# Patient Record
Sex: Female | Born: 1975 | Race: Black or African American | Hispanic: No | Marital: Married | Smoking: Never smoker
Health system: Southern US, Community
[De-identification: ages and names within clinical notes are randomized; demographics above are authoritative.]

## PROBLEM LIST (undated history)

## (undated) DIAGNOSIS — F32A Depression, unspecified: Secondary | ICD-10-CM

## (undated) DIAGNOSIS — K5900 Constipation, unspecified: Secondary | ICD-10-CM

## (undated) DIAGNOSIS — H5210 Myopia, unspecified eye: Secondary | ICD-10-CM

## (undated) DIAGNOSIS — H524 Presbyopia: Secondary | ICD-10-CM

## (undated) DIAGNOSIS — M25472 Effusion, left ankle: Secondary | ICD-10-CM

## (undated) DIAGNOSIS — D259 Leiomyoma of uterus, unspecified: Secondary | ICD-10-CM

## (undated) DIAGNOSIS — M25474 Effusion, right foot: Secondary | ICD-10-CM

## (undated) DIAGNOSIS — R0602 Shortness of breath: Secondary | ICD-10-CM

## (undated) DIAGNOSIS — I517 Cardiomegaly: Secondary | ICD-10-CM

## (undated) DIAGNOSIS — R5383 Other fatigue: Secondary | ICD-10-CM

## (undated) DIAGNOSIS — F419 Anxiety disorder, unspecified: Secondary | ICD-10-CM

## (undated) DIAGNOSIS — R002 Palpitations: Secondary | ICD-10-CM

## (undated) DIAGNOSIS — H52209 Unspecified astigmatism, unspecified eye: Secondary | ICD-10-CM

## (undated) DIAGNOSIS — M25471 Effusion, right ankle: Secondary | ICD-10-CM

## (undated) DIAGNOSIS — M25475 Effusion, left foot: Secondary | ICD-10-CM

## (undated) DIAGNOSIS — N938 Other specified abnormal uterine and vaginal bleeding: Secondary | ICD-10-CM

## (undated) DIAGNOSIS — I10 Essential (primary) hypertension: Secondary | ICD-10-CM

## (undated) HISTORY — DX: Shortness of breath: R06.02

## (undated) HISTORY — DX: Depression, unspecified: F32.A

## (undated) HISTORY — PX: TONSILLECTOMY AND ADENOIDECTOMY: SHX28

## (undated) HISTORY — PX: TONSILLECTOMY: SUR1361

## (undated) HISTORY — DX: Effusion, left ankle: M25.472

## (undated) HISTORY — DX: Unspecified astigmatism, unspecified eye: H52.209

## (undated) HISTORY — DX: Presbyopia: H52.4

## (undated) HISTORY — DX: Other fatigue: R53.83

## (undated) HISTORY — DX: Palpitations: R00.2

## (undated) HISTORY — DX: Constipation, unspecified: K59.00

## (undated) HISTORY — DX: Cardiomegaly: I51.7

## (undated) HISTORY — DX: Effusion, left foot: M25.471

## (undated) HISTORY — DX: Effusion, left foot: M25.475

## (undated) HISTORY — DX: Effusion, right foot: M25.474

## (undated) HISTORY — DX: Other specified abnormal uterine and vaginal bleeding: N93.8

## (undated) HISTORY — DX: Myopia, unspecified eye: H52.10

## (undated) HISTORY — DX: Leiomyoma of uterus, unspecified: D25.9

---

## 1994-11-26 HISTORY — PX: TONSILLECTOMY: SUR1361

## 2004-07-20 ENCOUNTER — Emergency Department (HOSPITAL_COMMUNITY): Admission: EM | Admit: 2004-07-20 | Discharge: 2004-07-20 | Payer: Self-pay | Admitting: Emergency Medicine

## 2004-08-06 ENCOUNTER — Emergency Department (HOSPITAL_COMMUNITY): Admission: EM | Admit: 2004-08-06 | Discharge: 2004-08-06 | Payer: Self-pay | Admitting: Emergency Medicine

## 2005-08-13 ENCOUNTER — Emergency Department (HOSPITAL_COMMUNITY): Admission: EM | Admit: 2005-08-13 | Discharge: 2005-08-13 | Payer: Self-pay | Admitting: Emergency Medicine

## 2005-11-27 ENCOUNTER — Emergency Department (HOSPITAL_COMMUNITY): Admission: EM | Admit: 2005-11-27 | Discharge: 2005-11-27 | Payer: Self-pay | Admitting: Emergency Medicine

## 2006-03-10 ENCOUNTER — Emergency Department (HOSPITAL_COMMUNITY): Admission: EM | Admit: 2006-03-10 | Discharge: 2006-03-11 | Payer: Self-pay | Admitting: Emergency Medicine

## 2007-04-05 ENCOUNTER — Emergency Department (HOSPITAL_COMMUNITY): Admission: EM | Admit: 2007-04-05 | Discharge: 2007-04-05 | Payer: Self-pay | Admitting: Emergency Medicine

## 2015-01-11 ENCOUNTER — Encounter (HOSPITAL_COMMUNITY): Payer: Self-pay | Admitting: *Deleted

## 2015-01-11 ENCOUNTER — Emergency Department (HOSPITAL_COMMUNITY)
Admission: EM | Admit: 2015-01-11 | Discharge: 2015-01-11 | Disposition: A | Discharge status: Home / Self Care | Payer: 59 | Source: Home / Self Care | Attending: Family Medicine | Admitting: Family Medicine

## 2015-01-11 ENCOUNTER — Emergency Department (INDEPENDENT_AMBULATORY_CARE_PROVIDER_SITE_OTHER): Payer: 59

## 2015-01-11 DIAGNOSIS — L03012 Cellulitis of left finger: Secondary | ICD-10-CM

## 2015-01-11 DIAGNOSIS — IMO0001 Reserved for inherently not codable concepts without codable children: Secondary | ICD-10-CM

## 2015-01-11 HISTORY — DX: Essential (primary) hypertension: I10

## 2015-01-11 MED ORDER — LIDOCAINE HCL (PF) 2 % IJ SOLN
INTRAMUSCULAR | Status: AC
  Filled 2015-01-11: qty 2

## 2015-01-11 MED ORDER — BUPIVACAINE HCL (PF) 0.5 % IJ SOLN
INTRAMUSCULAR | Status: AC
  Filled 2015-01-11: qty 10

## 2015-01-11 MED ORDER — HYDROCODONE-ACETAMINOPHEN 5-325 MG PO TABS: 1.0000 | ORAL_TABLET | ORAL | Status: DC | PRN

## 2015-01-11 MED ORDER — DOXYCYCLINE HYCLATE 100 MG PO CAPS: 100.0000 mg | ORAL_CAPSULE | Freq: Two times a day (BID) | ORAL | Status: DC

## 2015-01-11 NOTE — ED Notes (Addendum)
Pt reports    Swollen  Tender       l      Middle  Finger            X   4  Days        -      Also  Has  Some   Numbness   To  l   2nd    Toe    X  4  Days     -              Her  bp  Is  Elevated  As  Well         She  Has  Been  Non  Compliant     With  Her meds

## 2015-01-11 NOTE — ED Notes (Signed)
Wound dressed with adaptic, sterile 2x2 's, 1" cling and 1" coban by PA.

## 2015-01-11 NOTE — ED Notes (Signed)
Gaspar Bidding PA here and is doing I and D of felon.

## 2015-01-11 NOTE — ED Provider Notes (Signed)
CSN: [id number]     Arrival date & time 01/11/15  1508 History   First MD Initiated Contact with Patient 01/11/15 1626     Chief Complaint  Patient presents with  . Hand Pain   (Consider location/radiation/quality/duration/timing/severity/associated sxs/prior Treatment) Patient is a 39 y.o. female presenting with hand pain. The history is provided by the patient.  Hand Pain This is a new problem. The current episode started more than 2 days ago. The problem has been gradually worsening.    Past Medical History  Diagnosis Date  . Hypertension    History reviewed. No pertinent past surgical history. History reviewed. No pertinent family history. History  Substance Use Topics  . Smoking status: Never Smoker   . Smokeless tobacco: Not on file  . Alcohol Use: Yes   OB History    No data available     Review of Systems  Constitutional: Negative for fever and chills.  Musculoskeletal: Positive for joint swelling.  Skin: Positive for rash.    Allergies  Review of patient's allergies indicates no known allergies.  Home Medications   Prior to Admission medications   Medication Sig Start Date End Date Taking? Authorizing Provider  doxycycline (VIBRAMYCIN) 100 MG capsule Take 1 capsule (100 mg total) by mouth 2 (two) times daily. 01/11/15   Billy Fischer, MD  HYDROcodone-acetaminophen (NORCO/VICODIN) 5-325 MG per tablet Take 1 tablet by mouth every 4 (four) hours as needed for moderate pain or severe pain. 01/11/15   Billy Fischer, MD  Metoprolol Tartrate (LOPRESSOR PO) Take by mouth.    Historical Provider, MD   BP 180/110 mmHg  Pulse 72  Temp(Src) 98.6 F (37 C) (Oral)  Resp 18  SpO2 100%  LMP 12/28/2014 (Exact Date) Physical Exam  Constitutional: She is oriented to person, place, and time. She appears well-developed and well-nourished. She appears distressed.  Musculoskeletal: She exhibits tenderness.       Hands: Neurological: She is alert and oriented to person, place,  and time.  Skin: Skin is warm and dry.  Nursing note and vitals reviewed.   ED Course  Procedures (including critical care time) Labs Review Labs Reviewed  CULTURE, ROUTINE-ABSCESS    Imaging Review Dg Finger Middle Left  01/11/2015   CLINICAL DATA:  Onset LEFT middle finger swelling and pain 3 days ago, no known injury, question from infected cuticle, with swelling starting up LEFT arm yesterday  EXAM: LEFT MIDDLE FINGER 2+V  COMPARISON:  None  FINDINGS: Osseous mineralization grossly normal for technique.  Joint spaces preserved.  Tiny corticated old ossicle at volar aspect of PIP joint.  Soft tissue swelling LEFT middle finger.  No acute fracture, dislocation or bone destruction.  IMPRESSION: No acute osseous abnormalities.   Electronically Signed   By: Lavonia Dana M.D.   On: 01/11/2015 17:14     MDM   1. Paronychia of third finger, left    Referred to dr Amedeo Plenty for i+d of felon.    Billy Fischer, MD 01/11/15 [phone]

## 2015-01-11 NOTE — Discharge Instructions (Signed)
Care as discussed with dr Vanetta Shawl pa , follow-up on wed at 3pm,  Take medicines as prescribed.

## 2015-01-11 NOTE — Consult Note (Signed)
Jessica Griffin is an 39 y.o. female.   Chief Complaint: Left middle finger pain HPI: The patient is a very pleasant 39 year old female who has had progressive pain and swelling about the left middle finger beginning Saturday. She denies any antecedent trauma incident of injury. She states she has been soaking the finger in warm water which seems to help the pain unfortunately this is really current. She has had noted swelling increasing over the past 3 days and complains of point tenderness over the distal tip of the left middle finger. She is left-hand dominant. She is employed at [employer]. Given the progressive swelling and pain she presented to the urgent care setting for initial evaluation. She was initially seen and evaluated by Dr. Ruben Gottron, had noted concerns in regards to a possible paronychia process to the tip of the finger. Hand surgery consultation was implemented. Upon evaluation the patient denied any fevers, chills, or other constitutional symptoms. Grafts had been performed at the time of initial evaluation and were reviewed. Acute bony abnormalities were noted about the distal phalanx. There was a questionable old volar lip fracture about the PIP versus sesamoid. No Signs of deep bone infection were present about the distal phalanx. The patient does relate that her niece stays with her at times has had a prior diagnosis of MRSA.  Past Medical History  Diagnosis Date  . Hypertension     History reviewed. No pertinent past surgical history.  History reviewed. No pertinent family history. Social History:  reports that she has never smoked. She does not have any smokeless tobacco history on file. She reports that she drinks alcohol. Her drug history is not on file.  Allergies: No Known Allergies   (Not in a hospital admission)  No results found for this or any previous visit (from the past 48 hour(s)). Dg Finger Middle Left  01/11/2015   CLINICAL DATA:  Onset LEFT middle  finger swelling and pain 3 days ago, no known injury, question from infected cuticle, with swelling starting up LEFT arm yesterday  EXAM: LEFT MIDDLE FINGER 2+V  COMPARISON:  None  FINDINGS: Osseous mineralization grossly normal for technique.  Joint spaces preserved.  Tiny corticated old ossicle at volar aspect of PIP joint.  Soft tissue swelling LEFT middle finger.  No acute fracture, dislocation or bone destruction.  IMPRESSION: No acute osseous abnormalities.   Electronically Signed   By: Lavonia Dana M.D.   On: 01/11/2015 17:14    ROS  The patient denies any recent weight loss, night sweats, fever, chills, shortness of breath, chest pain, difficulties with bowel or bladder movements. We see history of present illness in regards to musculoskeletal review of systems.  Blood pressure 180/110, pulse 72, temperature 98.6 F (37 C), temperature source Oral, resp. rate 18, last menstrual period 12/28/2014, SpO2 100 %. Physical Exam  Evaluation of the left middle finger: There is swelling and hyperemia is present at the distal tip of the finger. She is exquisitely tender about the volar tuft of the finger increasing at the eponychial fold. There is no obvious  purulence present, however, she does have significant hyperemia and pain as well as swelling present. No signs of ascending purulent flexor tenosynovitis is present. Mid palmar space is nontender to phenol and hyperthenar space is nontender, FDS, FDP are intact, extensor mechanism is intact. Marland Kitchen.Patient presents for evaluation and treatment of the of their upper extremity predicament. The patient denies neck back chest or of abdominal pain. The patient notes  that they have no lower extremity problems. The patient from primarily complains of the upper extremity pain noted.  Assessment/Plan Left middle finger involving paronychia History of hypertension I have had a lengthy discussion with the patient in regards to her upper extremity and our concerns.  Have discussed with her the possibility of an evolving abscess and our recommendations to proceed with an I&D, given her physical findings. The patient states good understanding. After obtaining verbal consent patient underwent the following procedure:  1.  Digital block left middle finger utilizing 5 mL of Sensorcaine without epinephrine and 5 mL of 2% Xylocaine without epinephrine 2.  Incision and excisional debridement of her left middle finger paronychia 3. Partial nail plate removal left middle finger Procedure in detail: After obtaining verbal consent the palmar aspect of the hand was sterilely prepped. A digital block consisting of 5 mL of Xylocaine and 5 mL of Marcaine without epinephrine was then implemented she tolerated this well and there no complications. Once appropriate anesthetic was obtained the hand was prepped and draped in the usual sterile fashion. Attention was then turned to the radial aspect of her eponychial fold at this juncture a sharp incision was made about the eponychial fold with obvious purulence present. Intraoperative cultures were then obtained. During this sharp excisional debridement of necrotic and pre-necrotic tissue was performed about the skin, subcutaneous tissues as well. Copious irrigation was then implemented into the wound. Following this a partial nail plate excision was performed. The patient tolerated procedure very well. Once again copious irrigation was implemented and packing was placed about the open I and D site. That followed by soft bulky dressings were then applied. The patient tolerated this well. We have discussed with the patient the need to elevate the hand, keep her dressings clean, dry and intact. We will prescribe her doxycycline 100 mg 1 by mouth twice a day for 10 days and in addition Norco 03/28/2024 for pain. We will have her out of work for the rest of this evening and allow her to return to light work duties tomorrow, no aggressive gripping  twisting position or pulling with the left hand and keep her dressings clean dry and intact. We will plan to see her tomorrow at 3 PM at which juncture we will perform a dressing change and whirlpool. The patient will need daily wound care. We will monitor her closely. All questions were encouraged and answered. It was a pleasure to see her today.  Letia Guidry L 01/11/2015, 6:06 PM

## 2015-01-14 LAB — CULTURE, ROUTINE-ABSCESS

## 2015-04-30 ENCOUNTER — Emergency Department (HOSPITAL_BASED_OUTPATIENT_CLINIC_OR_DEPARTMENT_OTHER)
Admission: EM | Admit: 2015-04-30 | Discharge: 2015-04-30 | Disposition: A | Discharge status: Home / Self Care | Payer: 59 | Attending: Emergency Medicine | Admitting: Emergency Medicine

## 2015-04-30 ENCOUNTER — Encounter (HOSPITAL_BASED_OUTPATIENT_CLINIC_OR_DEPARTMENT_OTHER): Payer: Self-pay | Admitting: Emergency Medicine

## 2015-04-30 DIAGNOSIS — O210 Mild hyperemesis gravidarum: Secondary | ICD-10-CM | POA: Diagnosis present

## 2015-04-30 DIAGNOSIS — Z3490 Encounter for supervision of normal pregnancy, unspecified, unspecified trimester: Secondary | ICD-10-CM

## 2015-04-30 DIAGNOSIS — O10011 Pre-existing essential hypertension complicating pregnancy, first trimester: Secondary | ICD-10-CM | POA: Insufficient documentation

## 2015-04-30 DIAGNOSIS — Z792 Long term (current) use of antibiotics: Secondary | ICD-10-CM | POA: Insufficient documentation

## 2015-04-30 DIAGNOSIS — Z3A01 Less than 8 weeks gestation of pregnancy: Secondary | ICD-10-CM | POA: Diagnosis not present

## 2015-04-30 DIAGNOSIS — Z79899 Other long term (current) drug therapy: Secondary | ICD-10-CM | POA: Insufficient documentation

## 2015-04-30 LAB — CBC WITH DIFFERENTIAL/PLATELET
Basophils Absolute: 0 10*3/uL (ref 0.0–0.1)
Basophils Relative: 0 % (ref 0–1)
EOS PCT: 2 % (ref 0–5)
Eosinophils Absolute: 0.1 10*3/uL (ref 0.0–0.7)
HCT: 37.8 % (ref 36.0–46.0)
Hemoglobin: 12.5 g/dL (ref 12.0–15.0)
LYMPHS ABS: 1.1 10*3/uL (ref 0.7–4.0)
Lymphocytes Relative: 16 % (ref 12–46)
MCH: 25.4 pg — AB (ref 26.0–34.0)
MCHC: 33.1 g/dL (ref 30.0–36.0)
MCV: 76.8 fL — ABNORMAL LOW (ref 78.0–100.0)
Monocytes Absolute: 0.5 10*3/uL (ref 0.1–1.0)
Monocytes Relative: 7 % (ref 3–12)
NEUTROS ABS: 5.3 10*3/uL (ref 1.7–7.7)
Neutrophils Relative %: 75 % (ref 43–77)
PLATELETS: 235 10*3/uL (ref 150–400)
RBC: 4.92 MIL/uL (ref 3.87–5.11)
RDW: 14.2 % (ref 11.5–15.5)
WBC: 7 10*3/uL (ref 4.0–10.5)

## 2015-04-30 LAB — COMPREHENSIVE METABOLIC PANEL
ALK PHOS: 51 U/L (ref 38–126)
ALT: 18 U/L (ref 14–54)
AST: 26 U/L (ref 15–41)
Albumin: 4.1 g/dL (ref 3.5–5.0)
Anion gap: 8 (ref 5–15)
BUN: 12 mg/dL (ref 6–20)
CHLORIDE: 103 mmol/L (ref 101–111)
CO2: 25 mmol/L (ref 22–32)
Calcium: 8.8 mg/dL — ABNORMAL LOW (ref 8.9–10.3)
Creatinine, Ser: 0.67 mg/dL (ref 0.44–1.00)
GFR calc non Af Amer: >60 mL/min (ref >60)
GLUCOSE: 99 mg/dL (ref 65–99)
POTASSIUM: 3.8 mmol/L (ref 3.5–5.1)
SODIUM: 136 mmol/L (ref 135–145)
TOTAL PROTEIN: 7.2 g/dL (ref 6.5–8.1)
Total Bilirubin: 0.6 mg/dL (ref 0.3–1.2)

## 2015-04-30 LAB — URINALYSIS, ROUTINE W REFLEX MICROSCOPIC
Bilirubin Urine: NEGATIVE
Glucose, UA: NEGATIVE mg/dL
Ketones, ur: NEGATIVE mg/dL
NITRITE: NEGATIVE
Protein, ur: NEGATIVE mg/dL
Specific Gravity, Urine: 1.01 (ref 1.005–1.030)
Urobilinogen, UA: 0.2 mg/dL (ref 0.0–1.0)
pH: 7.5 (ref 5.0–8.0)

## 2015-04-30 LAB — PREGNANCY, URINE: Preg Test, Ur: POSITIVE — AB

## 2015-04-30 LAB — URINE MICROSCOPIC-ADD ON

## 2015-04-30 LAB — LIPASE, BLOOD: Lipase: 16 U/L — ABNORMAL LOW (ref 22–51)

## 2015-04-30 MED ORDER — ONDANSETRON HCL 4 MG/2ML IJ SOLN
4.0000 mg | Freq: Once | INTRAMUSCULAR | Status: AC
  Administered 2015-04-30: 4 mg via INTRAVENOUS
  Filled 2015-04-30: qty 2

## 2015-04-30 MED ORDER — DOXYLAMINE-PYRIDOXINE 10-10 MG PO TBEC: 1.0000 | DELAYED_RELEASE_TABLET | Freq: Two times a day (BID) | ORAL | Status: DC | PRN

## 2015-04-30 NOTE — Discharge Instructions (Signed)

## 2015-04-30 NOTE — ED Provider Notes (Signed)
CSN: [id number]     Arrival date & time 04/30/15  0710 History   First MD Initiated Contact with Patient 04/30/15 [phone]     Chief Complaint  Patient presents with  . Nausea     (Consider location/radiation/quality/duration/timing/severity/associated sxs/prior Treatment) HPI Comments: Patient presents to the ER for evaluation of nausea. Patient reports that for the last 2 or 3 weeks he has been having constant nausea. She reports it is worse when she wakes in the morning. She has only vomited a couple of times and has just been small amounts of "phlegm". She has not been noticing any abdominal pain associated with the symptoms, but she does have a "burning, hot sensation" across her upper abdomen. Patient denies diarrhea, constipation. She does report that she has been "feeling out of it". She says her coordination has been off, but she has not noticed any specific confusion, numbness, tingling, weakness.   Past Medical History  Diagnosis Date  . Hypertension    History reviewed. No pertinent past surgical history. No family history on file. History  Substance Use Topics  . Smoking status: Never Smoker   . Smokeless tobacco: Not on file  . Alcohol Use: Yes   OB History    No data available     Review of Systems  Respiratory: Negative for shortness of breath.   Cardiovascular: Negative for chest pain.  Gastrointestinal: Positive for nausea. Negative for constipation.  All other systems reviewed and are negative.     Allergies  Review of patient's allergies indicates no known allergies.  Home Medications   Prior to Admission medications   Medication Sig Start Date End Date Taking? Authorizing Provider  doxycycline (VIBRAMYCIN) 100 MG capsule Take 1 capsule (100 mg total) by mouth 2 (two) times daily. 01/11/15   Billy Fischer, MD  HYDROcodone-acetaminophen (NORCO/VICODIN) 5-325 MG per tablet Take 1 tablet by mouth every 4 (four) hours as needed for moderate pain or severe pain.  01/11/15   Billy Fischer, MD  Metoprolol Tartrate (LOPRESSOR PO) Take by mouth.    Historical Provider, MD   BP 144/89 mmHg  Pulse 83  Temp(Src) 98.4 F (36.9 C) (Oral)  Resp 18  Ht 5\' 4"  (1.626 m)  Wt 182 lb (82.555 kg)  BMI 31.22 kg/m2  SpO2 100% Physical Exam  Constitutional: She is oriented to person, place, and time. She appears well-developed and well-nourished. No distress.  HENT:  Head: Normocephalic and atraumatic.  Right Ear: Hearing normal.  Left Ear: Hearing normal.  Nose: Nose normal.  Mouth/Throat: Oropharynx is clear and moist and mucous membranes are normal.  Eyes: Conjunctivae and EOM are normal. Pupils are equal, round, and reactive to light.  Neck: Normal range of motion. Neck supple.  Cardiovascular: Regular rhythm, S1 normal and S2 normal.  Exam reveals no gallop and no friction rub.   No murmur heard. Pulmonary/Chest: Effort normal and breath sounds normal. No respiratory distress. She exhibits no tenderness.  Abdominal: Soft. Normal appearance and bowel sounds are normal. There is no hepatosplenomegaly. There is tenderness in the epigastric area. There is no rebound, no guarding, no tenderness at McBurney's point and negative Murphy's sign. No hernia.  Musculoskeletal: Normal range of motion.  Neurological: She is alert and oriented to person, place, and time. She has normal strength. No cranial nerve deficit or sensory deficit. Coordination normal. GCS eye subscore is 4. GCS verbal subscore is 5. GCS motor subscore is 6.  Extraocular muscle movement: normal No visual field cut  Pupils: equal and reactive both direct and consensual response is normal No nystagmus present    Sensory function is intact to light touch, pinprick Proprioception intact  Grip strength 5/5 symmetric in upper extremities No pronator drift Normal finger to nose bilaterally  Lower extremity strength 5/5 against gravity Normal heel to shin bilaterally  Gait: normal   Skin: Skin  is warm, dry and intact. No rash noted. No cyanosis.  Psychiatric: She has a normal mood and affect. Her speech is normal and behavior is normal. Thought content normal.  Nursing note and vitals reviewed.   ED Course  Procedures (including critical care time) Labs Review Labs Reviewed  URINALYSIS, ROUTINE W REFLEX MICROSCOPIC (NOT AT [facility])  PREGNANCY, URINE  CBC WITH DIFFERENTIAL/PLATELET  COMPREHENSIVE METABOLIC PANEL  LIPASE, BLOOD    Imaging Review No results found.   EKG Interpretation None      MDM   Final diagnoses:  None   pregnancy  Patient presents to the ER for evaluation of nausea. Patient reports that this has been ongoing for several weeks. She has a benign examination. There was mild epigastric tenderness, no guarding or rebound. No lower abdominal pain or tenderness. No pelvic complaints. Patient's workup reveals positive pregnancy test. She reports that she was not sure if she had a period in May because she had a very busy month. Her last menstrual period would have been April 15. This puts her at 7 weeks. Patient reassured that nausea is likely morning sickness, no further workup necessary at this time. Patient will initiate prenatal vitamins. Follow-up with OB/GYN.    Orpah Greek, MD 04/30/15 [phone]

## 2015-04-30 NOTE — ED Notes (Signed)
Pt c/o nausea and lack of coordination along with feeling "just out of it" for past 3 weeks.  Pt states she came in today because she told herself yesterday "if I wake up nauseous tomorrow, I'm going in to be seen."  Pt denies any active vomiting, diarrhea, dizziness or abdominal pain.  Pt states "burning feeling" in URQ with palpation.

## 2015-05-04 ENCOUNTER — Encounter (HOSPITAL_COMMUNITY): Payer: Self-pay | Admitting: *Deleted

## 2015-05-04 ENCOUNTER — Emergency Department (HOSPITAL_COMMUNITY)
Admission: EM | Admit: 2015-05-04 | Discharge: 2015-05-05 | Disposition: A | Discharge status: Home / Self Care | Payer: 59 | Attending: Emergency Medicine | Admitting: Emergency Medicine

## 2015-05-04 ENCOUNTER — Emergency Department (HOSPITAL_COMMUNITY): Payer: 59

## 2015-05-04 DIAGNOSIS — O200 Threatened abortion: Secondary | ICD-10-CM | POA: Diagnosis not present

## 2015-05-04 DIAGNOSIS — Z3A09 9 weeks gestation of pregnancy: Secondary | ICD-10-CM | POA: Insufficient documentation

## 2015-05-04 DIAGNOSIS — O209 Hemorrhage in early pregnancy, unspecified: Secondary | ICD-10-CM | POA: Diagnosis present

## 2015-05-04 DIAGNOSIS — I10 Essential (primary) hypertension: Secondary | ICD-10-CM | POA: Diagnosis not present

## 2015-05-04 DIAGNOSIS — Z792 Long term (current) use of antibiotics: Secondary | ICD-10-CM | POA: Insufficient documentation

## 2015-05-04 DIAGNOSIS — N939 Abnormal uterine and vaginal bleeding, unspecified: Secondary | ICD-10-CM

## 2015-05-04 LAB — CBC WITH DIFFERENTIAL/PLATELET
BASOS PCT: 0 % (ref 0–1)
Basophils Absolute: 0 10*3/uL (ref 0.0–0.1)
Eosinophils Absolute: 0.1 10*3/uL (ref 0.0–0.7)
Eosinophils Relative: 1 % (ref 0–5)
HCT: 39 % (ref 36.0–46.0)
HEMOGLOBIN: 12.8 g/dL (ref 12.0–15.0)
LYMPHS ABS: 2.7 10*3/uL (ref 0.7–4.0)
LYMPHS PCT: 26 % (ref 12–46)
MCH: 25 pg — ABNORMAL LOW (ref 26.0–34.0)
MCHC: 32.8 g/dL (ref 30.0–36.0)
MCV: 76 fL — ABNORMAL LOW (ref 78.0–100.0)
Monocytes Absolute: 0.7 10*3/uL (ref 0.1–1.0)
Monocytes Relative: 7 % (ref 3–12)
Neutro Abs: 6.7 10*3/uL (ref 1.7–7.7)
Neutrophils Relative %: 66 % (ref 43–77)
PLATELETS: 246 10*3/uL (ref 150–400)
RBC: 5.13 MIL/uL — ABNORMAL HIGH (ref 3.87–5.11)
RDW: 14.3 % (ref 11.5–15.5)
WBC: 10.2 10*3/uL (ref 4.0–10.5)

## 2015-05-04 LAB — URINALYSIS, ROUTINE W REFLEX MICROSCOPIC
Glucose, UA: NEGATIVE mg/dL
KETONES UR: 15 mg/dL — AB
NITRITE: NEGATIVE
PH: 5 (ref 5.0–8.0)
PROTEIN: 30 mg/dL — AB
Specific Gravity, Urine: 1.026 (ref 1.005–1.030)
Urobilinogen, UA: 0.2 mg/dL (ref 0.0–1.0)

## 2015-05-04 LAB — WET PREP, GENITAL
Clue Cells Wet Prep HPF POC: NONE SEEN
Trich, Wet Prep: NONE SEEN
WBC WET PREP: NONE SEEN
Yeast Wet Prep HPF POC: NONE SEEN

## 2015-05-04 LAB — ABO/RH: ABO/RH(D): A POS

## 2015-05-04 LAB — URINE MICROSCOPIC-ADD ON

## 2015-05-04 LAB — PREGNANCY, URINE: Preg Test, Ur: POSITIVE — AB

## 2015-05-04 LAB — HCG, QUANTITATIVE, PREGNANCY: HCG, BETA CHAIN, QUANT, S: 3993 m[IU]/mL — AB (ref <5)

## 2015-05-04 NOTE — ED Notes (Signed)
Returned from Ultrasound

## 2015-05-04 NOTE — ED Provider Notes (Signed)
CSN: [id number]     Arrival date & time 05/04/15  1817 History   First MD Initiated Contact with Patient 05/04/15 2111     Chief Complaint  Patient presents with  . Vaginal Bleeding  . Threatened Miscarriage   HPI Ms. Jessica Griffin is a 39yo woman with PMHx of HTN and recent diagnosis of pregnancy on 04/30/15 presenting with vaginal bleeding. She reports she had some light spotting yesterday. Then today while at work she felt a "gush" and went to the toilet immediately and noticed blood clots in the toilet as well as blood in the water. She states she had mild abdominal pain and nausea after this happened. She denies these symptoms currently. She denies any previous miscarriages or abortions. She has had 1 other pregnancy that was uncomplicated. She reports sexual activity with 1 partner. She last had STD testing about 1 year ago and was normal.    Past Medical History  Diagnosis Date  . Hypertension    Past Surgical History  Procedure Laterality Date  . Tonsillectomy     No family history on file. History  Substance Use Topics  . Smoking status: Never Smoker   . Smokeless tobacco: Not on file  . Alcohol Use: Yes   OB History    Gravida Para Term Preterm AB TAB SAB Ectopic Multiple Living   1              Review of Systems General: Denies fever, chills, night sweats, changes in weight, changes in appetite HEENT: Denies headaches, ear pain, changes in vision, rhinorrhea, sore throat CV: Denies CP, palpitations, SOB, orthopnea Pulm: Denies SOB, cough, wheezing GI: Denies diarrhea, constipation, melena, hematochezia GU: Denies dysuria, hematuria, frequency Msk: Denies muscle cramps, joint pains Neuro: Denies weakness, numbness, tingling Skin: Denies rashes, bruising   Allergies  Sulfa antibiotics  Home Medications   Prior to Admission medications   Medication Sig Start Date End Date Taking? Authorizing Provider  doxycycline (VIBRAMYCIN) 100 MG capsule Take 1 capsule (100 mg  total) by mouth 2 (two) times daily. 01/11/15   Billy Fischer, MD  Doxylamine-Pyridoxine 10-10 MG TBEC Take 1 tablet by mouth 2 (two) times daily as needed (NAUSEA). 04/30/15   Orpah Greek, MD  HYDROcodone-acetaminophen (NORCO/VICODIN) 5-325 MG per tablet Take 1 tablet by mouth every 4 (four) hours as needed for moderate pain or severe pain. 01/11/15   Billy Fischer, MD  Metoprolol Tartrate (LOPRESSOR PO) Take by mouth.    Historical Provider, MD   BP 148/88 mmHg  Pulse 74  Temp(Src) 98.6 F (37 C) (Oral)  Resp 24  SpO2 100%  LMP 03/11/2015 (Approximate) Physical Exam General: middle aged woman sitting up in bed, NAD  HEENT: [place]/AT, EOMI, sclera anicteric, mucus membranes moist CV: RRR, no m/g/r Pulm: CTA bilaterally, breaths non-labored Abd: BS+, soft, non-tender, non-distended GU: Normal appearing external genitalia. Small amount of blood coming from vaginal vault. There is a small amount of blood in the vaginal vault but no significant bleeding. Cervix appears normal, no evidence of dilation or fetal tissue.  Ext: warm, no edema  Neuro: alert and oriented x 3, no focal deficits   ED Course  Procedures (including critical care time) Labs Review Labs Reviewed  URINALYSIS, ROUTINE W REFLEX MICROSCOPIC (NOT AT [facility]) - Abnormal; Notable for the following:    Color, Urine RED (*)    APPearance TURBID (*)    Hgb urine dipstick LARGE (*)    Bilirubin Urine MODERATE (*)  Ketones, ur 15 (*)    Protein, ur 30 (*)    Leukocytes, UA SMALL (*)    All other components within normal limits  PREGNANCY, URINE - Abnormal; Notable for the following:    Preg Test, Ur POSITIVE (*)    All other components within normal limits  URINE MICROSCOPIC-ADD ON - Abnormal; Notable for the following:    Squamous Epithelial / LPF FEW (*)    All other components within normal limits  CBC WITH DIFFERENTIAL/PLATELET - Abnormal; Notable for the following:    RBC 5.13 (*)    MCV 76.0 (*)    MCH 25.0  (*)    All other components within normal limits  HCG, QUANTITATIVE, PREGNANCY - Abnormal; Notable for the following:    hCG, Beta Chain, Quant, S 3993 (*)    All other components within normal limits  WET PREP, GENITAL  ABO/RH  GC/CHLAMYDIA PROBE AMP ([place]) NOT AT [facility]    Imaging Review US Ob Comp Less 14 Wks  05/04/2015   CLINICAL DATA:  Vaginal bleeding.  EXAM: OBSTETRIC <14 WK [place] AND TRANSVAGINAL OB US  TECHNIQUE: Both transabdominal and transvaginal ultrasound examinations were performed for complete evaluation of the gestation as well as the maternal uterus, adnexal regions, and pelvic cul-de-sac. Transvaginal technique was performed to assess early pregnancy.  COMPARISON:  None.  FINDINGS: Intrauterine gestational sac: Gestational sac appears to contain echogenic debris.  Yolk sac:  Yes  Embryo:  No  Cardiac Activity: No  Heart Rate: 0  bpm  MSD: 6.4  mm   5 w   2  d  [place] EDC: 01/02/2016  Maternal uterus/adnexae: There are multiple uterine fibroids, measuring up to 2.1 cm. Both ovaries appear normal. No abnormal pelvic fluid collections.  IMPRESSION: There is an intrauterine gestational sac, possibly containing echogenic debris. No visible fetal parts or cardiac activity. Findings are inconclusive with regard to viability of this gestation. Recommend follow-up quantitative B-HCG levels and follow-up US in 14 days to assess viability. This recommendation follows SRU consensus guidelines: Diagnostic Criteria for Nonviable Pregnancy Early in the First Trimester. [place] Med 2013; 119:1478-29.  Multiple uterine fibroids, measuring up to 2.1 cm.   Electronically Signed   By: Andreas Newport M.D.   On: 05/04/2015 23:57   US Ob Transvaginal  05/04/2015   CLINICAL DATA:  Vaginal bleeding.  EXAM: OBSTETRIC <14 WK [place] AND TRANSVAGINAL OB US  TECHNIQUE: Both transabdominal and transvaginal ultrasound examinations were performed for complete evaluation of the gestation as well as the maternal uterus,  adnexal regions, and pelvic cul-de-sac. Transvaginal technique was performed to assess early pregnancy.  COMPARISON:  None.  FINDINGS: Intrauterine gestational sac: Gestational sac appears to contain echogenic debris.  Yolk sac:  Yes  Embryo:  No  Cardiac Activity: No  Heart Rate: 0  bpm  MSD: 6.4  mm   5 w   2  d  [place] EDC: 01/02/2016  Maternal uterus/adnexae: There are multiple uterine fibroids, measuring up to 2.1 cm. Both ovaries appear normal. No abnormal pelvic fluid collections.  IMPRESSION: There is an intrauterine gestational sac, possibly containing echogenic debris. No visible fetal parts or cardiac activity. Findings are inconclusive with regard to viability of this gestation. Recommend follow-up quantitative B-HCG levels and follow-up US in 14 days to assess viability. This recommendation follows SRU consensus guidelines: Diagnostic Criteria for Nonviable Pregnancy Early in the First Trimester. [place] Med 2013; 562:1308-65.  Multiple uterine fibroids, measuring up to  2.1 cm.   Electronically Signed   By: Andreas Newport M.D.   On: 05/04/2015 23:57     EKG Interpretation None      MDM   Final diagnoses:  Threatened abortion in early pregnancy    39yo woman with recently diagnosed pregnancy presenting with 2 days of vaginal bleeding. On exam, there is a minimal amount of blood and no evidence of fetal tissue. Possible that she had a miscarriage earlier today when she noted clots. Will get transvaginal US, CBC, beta hCG, wet prep, and GC/chlamydia.   Hbg stable at 12.8. Wet prep negative. Transvaginal US shows an intrauterine gestational sac but no visible fetal parts or cardiac activity appreciated. Given her lower than expected hCG level it is likely that she has miscarried, but will need repeat hCG level and possibly [place] in 2 weeks to confirm. Patient was informed of the results of the ultrasound. She was instructed to contact her OBGYN tomorrow and get in to see her within the next 2  weeks to have repeat testing.   Juliet Rude, MD 05/05/15 [id number]  Leonard Schwartz, MD 05/05/15 [phone]

## 2015-05-04 NOTE — ED Notes (Signed)
Pt reports onset of vaginal bleeding with clots onset today. LMP April 15. Confirmed pregnancy at [facility] [facility]. One full term pregnancy. No previous miscarriages or abortions.

## 2015-05-04 NOTE — ED Notes (Signed)
To ultrasound

## 2015-05-05 LAB — GC/CHLAMYDIA PROBE AMP (~~LOC~~) NOT AT ARMC
Chlamydia: NEGATIVE
Neisseria Gonorrhea: NEGATIVE

## 2015-05-05 NOTE — Discharge Instructions (Signed)
- Contact your OBGYN tomorrow morning and let them know you possibly had a miscarriage - You need to have an appointment within the next 2 weeks to have repeat hCG testing and possibly another ultrasound   Threatened Miscarriage A threatened miscarriage occurs when you have vaginal bleeding during your first 20 weeks of pregnancy but the pregnancy has not ended. If you have vaginal bleeding during this time, your health care provider will do tests to make sure you are still pregnant. If the tests show you are still pregnant and the developing baby (fetus) inside your womb (uterus) is still growing, your condition is considered a threatened miscarriage. A threatened miscarriage does not mean your pregnancy will end, but it does increase the risk of losing your pregnancy (complete miscarriage). CAUSES  The cause of a threatened miscarriage is usually not known. If you go on to have a complete miscarriage, the most common cause is an abnormal number of chromosomes in the developing baby. Chromosomes are the structures inside cells that hold all your genetic material. Some causes of vaginal bleeding that do not result in miscarriage include:  Having sex.  Having an infection.  Normal hormone changes of pregnancy.  Bleeding that occurs when an egg implants in your uterus. RISK FACTORS Risk factors for bleeding in early pregnancy include:  Obesity.  Smoking.  Drinking excessive amounts of alcohol or caffeine.  Recreational drug use. SIGNS AND SYMPTOMS  Light vaginal bleeding.  Mild abdominal pain or cramps. DIAGNOSIS  If you have bleeding with or without abdominal pain before 20 weeks of pregnancy, your health care provider will do tests to check whether you are still pregnant. One important test involves using sound waves and a computer (ultrasound) to create images of the inside of your uterus. Other tests include an internal exam of your vagina and uterus (pelvic exam) and measurement  of your baby's heart rate.  You may be diagnosed with a threatened miscarriage if:  Ultrasound testing shows you are still pregnant.  Your baby's heart rate is strong.  A pelvic exam shows that the opening between your uterus and your vagina (cervix) is closed.  Your heart rate and blood pressure are stable.  Blood tests confirm you are still pregnant. TREATMENT  No treatments have been shown to prevent a threatened miscarriage from going on to a complete miscarriage. However, the right home care is important.  HOME CARE INSTRUCTIONS   Make sure you keep all your appointments for prenatal care. This is very important.  Get plenty of rest.  Do not have sex or use tampons if you have vaginal bleeding.  Do not douche.  Do not smoke or use recreational drugs.  Do not drink alcohol.  Avoid caffeine. SEEK MEDICAL CARE IF:  You have light vaginal bleeding or spotting while pregnant.  You have abdominal pain or cramping.  You have a fever. SEEK IMMEDIATE MEDICAL CARE IF:  You have heavy vaginal bleeding.  You have blood clots coming from your vagina.  You have severe low back pain or abdominal cramps.  You have fever, chills, and severe abdominal pain. MAKE SURE YOU:  Understand these instructions.  Will watch your condition.  Will get help right away if you are not doing well or get worse. Document Released: 11/12/2005 Document Revised: 11/17/2013 Document Reviewed: 09/08/2013 [facility] Patient Information 2015 [place], [place]. This information is not intended to replace advice given to you by your health care provider. Make sure you discuss any questions you have  with your health care provider. ° °

## 2015-05-06 ENCOUNTER — Other Ambulatory Visit: Payer: 59

## 2015-05-06 DIAGNOSIS — O209 Hemorrhage in early pregnancy, unspecified: Secondary | ICD-10-CM

## 2015-05-06 LAB — HCG, QUANTITATIVE, PREGNANCY: hCG, Beta Chain, Quant, S: 742 m[IU]/mL — ABNORMAL HIGH (ref <5)

## 2015-05-09 ENCOUNTER — Telehealth: Payer: Self-pay | Admitting: General Practice

## 2015-05-09 NOTE — Telephone Encounter (Signed)
Telephone call to patient regarding bhcg results and need for repeat in one week. Called patient and informed her of results and need for follow up in one week. Patient verbalized understanding and states she can come 6/23 @ 9am. Patient had no questions

## 2015-05-19 ENCOUNTER — Other Ambulatory Visit: Payer: 59

## 2015-05-19 DIAGNOSIS — O209 Hemorrhage in early pregnancy, unspecified: Secondary | ICD-10-CM

## 2015-05-20 LAB — HCG, QUANTITATIVE, PREGNANCY: hCG, Beta Chain, Quant, S: 2.9 m[IU]/mL

## 2015-12-29 DIAGNOSIS — Z3041 Encounter for surveillance of contraceptive pills: Secondary | ICD-10-CM | POA: Diagnosis not present

## 2015-12-29 DIAGNOSIS — J019 Acute sinusitis, unspecified: Secondary | ICD-10-CM | POA: Diagnosis not present

## 2016-07-19 ENCOUNTER — Ambulatory Visit: Payer: 59 | Admitting: Skilled Nursing Facility1

## 2016-07-23 DIAGNOSIS — H5213 Myopia, bilateral: Secondary | ICD-10-CM | POA: Diagnosis not present

## 2016-10-08 DIAGNOSIS — I10 Essential (primary) hypertension: Secondary | ICD-10-CM | POA: Diagnosis not present

## 2016-10-08 DIAGNOSIS — F418 Other specified anxiety disorders: Secondary | ICD-10-CM | POA: Diagnosis not present

## 2016-12-24 ENCOUNTER — Emergency Department (HOSPITAL_BASED_OUTPATIENT_CLINIC_OR_DEPARTMENT_OTHER): Payer: 59

## 2016-12-24 ENCOUNTER — Encounter (HOSPITAL_BASED_OUTPATIENT_CLINIC_OR_DEPARTMENT_OTHER): Payer: Self-pay | Admitting: Emergency Medicine

## 2016-12-24 ENCOUNTER — Emergency Department (HOSPITAL_BASED_OUTPATIENT_CLINIC_OR_DEPARTMENT_OTHER)
Admission: EM | Admit: 2016-12-24 | Discharge: 2016-12-25 | Disposition: A | Discharge status: Home / Self Care | Payer: 59 | Attending: Emergency Medicine | Admitting: Emergency Medicine

## 2016-12-24 DIAGNOSIS — R05 Cough: Secondary | ICD-10-CM | POA: Diagnosis not present

## 2016-12-24 DIAGNOSIS — M5440 Lumbago with sciatica, unspecified side: Secondary | ICD-10-CM | POA: Diagnosis not present

## 2016-12-24 DIAGNOSIS — M545 Low back pain: Secondary | ICD-10-CM | POA: Diagnosis not present

## 2016-12-24 DIAGNOSIS — R079 Chest pain, unspecified: Secondary | ICD-10-CM | POA: Diagnosis not present

## 2016-12-24 DIAGNOSIS — I10 Essential (primary) hypertension: Secondary | ICD-10-CM | POA: Diagnosis not present

## 2016-12-24 DIAGNOSIS — Z79899 Other long term (current) drug therapy: Secondary | ICD-10-CM | POA: Insufficient documentation

## 2016-12-24 LAB — URINALYSIS, ROUTINE W REFLEX MICROSCOPIC
BILIRUBIN URINE: NEGATIVE
Glucose, UA: NEGATIVE mg/dL
HGB URINE DIPSTICK: NEGATIVE
KETONES UR: NEGATIVE mg/dL
Leukocytes, UA: NEGATIVE
Nitrite: NEGATIVE
PH: 7 (ref 5.0–8.0)
Protein, ur: NEGATIVE mg/dL
SPECIFIC GRAVITY, URINE: 1.009 (ref 1.005–1.030)

## 2016-12-24 LAB — PREGNANCY, URINE: Preg Test, Ur: NEGATIVE

## 2016-12-24 LAB — D-DIMER, QUANTITATIVE: D-Dimer, Quant: 0.3 ug/mL-FEU (ref 0.00–0.50)

## 2016-12-24 MED ORDER — NAPROXEN 250 MG PO TABS
500.0000 mg | ORAL_TABLET | Freq: Once | ORAL | Status: AC
  Administered 2016-12-24: 500 mg via ORAL
  Filled 2016-12-24: qty 2

## 2016-12-24 NOTE — ED Provider Notes (Signed)
[facility] Provider Note   CSN: [id number] Arrival date & time: 12/24/16  1930   By signing my name below, I, Jessica Griffin, attest that this documentation has been prepared under the direction and in the presence of Jessica Essex, MD . Electronically Signed: Neta Griffin, ED Scribe. 12/24/2016. 11:11 PM.   History   Chief Complaint Chief Complaint  Patient presents with  . Back Pain    The history is provided by the patient. No language interpreter was used.   HPI Comments:  Jessica Griffin is a 41 y.o. female with PMHx of HTN who presents to the Emergency Department complaining of constant lower back pain x 1 week. Pt describes the pain as "pressure" and states that the pain radiates to her right hip. Pt complains of associated sinus pressure. Pt was recently treated for her pain last week and was given IcyHot with no relief. Pt has also taken Aleve and a heat patch today with no relief. Pt denies any chance of pregnancy. Pt denies numbness, tingling, urinary symptoms, bowel/bladder incontinence, chest pain, cough, sore throat.    Past Medical History:  Diagnosis Date  . Hypertension     There are no active problems to display for this patient.   Past Surgical History:  Procedure Laterality Date  . TONSILLECTOMY      OB History    Gravida Para Term Preterm AB Living   1             SAB TAB Ectopic Multiple Live Births                   Home Medications    Prior to Admission medications   Medication Sig Start Date End Date Taking? Authorizing Provider  benazepril-hydrochlorthiazide (LOTENSIN HCT) 20-25 MG tablet Take 1 tablet by mouth daily.   Yes Historical Provider, MD  doxycycline (VIBRAMYCIN) 100 MG capsule Take 1 capsule (100 mg total) by mouth 2 (two) times daily. 01/11/15   Billy Fischer, MD  Doxylamine-Pyridoxine 10-10 MG TBEC Take 1 tablet by mouth 2 (two) times daily as needed (NAUSEA). 04/30/15   Orpah Greek, MD    HYDROcodone-acetaminophen (NORCO/VICODIN) 5-325 MG per tablet Take 1 tablet by mouth every 4 (four) hours as needed for moderate pain or severe pain. 01/11/15   Billy Fischer, MD  Metoprolol Tartrate (LOPRESSOR PO) Take by mouth.    Historical Provider, MD    Family History History reviewed. No pertinent family history.  Social History Social History  Substance Use Topics  . Smoking status: Never Smoker  . Smokeless tobacco: Never Used  . Alcohol use Yes     Allergies   Sulfa antibiotics   Review of Systems Review of Systems  HENT: Positive for sinus pressure. Negative for sore throat.   Respiratory: Negative for cough.   Cardiovascular: Negative for chest pain.  Genitourinary: Negative for dysuria, frequency, hematuria and urgency.  Musculoskeletal: Positive for back pain.  Neurological: Negative for numbness.  All other systems reviewed and are negative.    Physical Exam Updated Vital Signs BP (!) 162/104 (BP Location: Right Arm)   Pulse 85   Temp 98.6 F (37 C) (Oral)   Resp 18   Ht 5' 4.5" (1.638 m)   Wt 205 lb (93 kg)   LMP 11/26/2016   SpO2 100%   BMI 34.64 kg/m   Physical Exam  Constitutional: She is oriented to person, place, and time. She appears well-developed and well-nourished.  No distress.  HENT:  Head: Normocephalic and atraumatic.  Mouth/Throat: Oropharynx is clear and moist. No oropharyngeal exudate.  Sinus congestion  Eyes: Conjunctivae and EOM are normal. Pupils are equal, round, and reactive to light.  Neck: Normal range of motion. Neck supple.  No meningismus.  Cardiovascular: Normal rate, regular rhythm, normal heart sounds and intact distal pulses.   No murmur heard. Pulmonary/Chest: Effort normal and breath sounds normal. No respiratory distress.  Abdominal: Soft. There is no tenderness. There is no rebound and no guarding.  Musculoskeletal: Normal range of motion. She exhibits no edema or tenderness.  Right paraspinal pain, no  midline pain. 5/5 strength in bilateral lower extremities. Ankle plantar and dorsiflexion intact. Great toe extension intact bilaterally. +2 DP and PT pulses. +2 patellar reflexes bilaterally. Normal gait.   Neurological: She is alert and oriented to person, place, and time. No cranial nerve deficit. She exhibits normal muscle tone. Coordination normal.  No ataxia on finger to nose bilaterally. No pronator drift. 5/5 strength throughout. CN 2-12 intact.Equal grip strength. Sensation intact.   Skin: Skin is warm.  Psychiatric: She has a normal mood and affect. Her behavior is normal.  Nursing note and vitals reviewed.    ED Treatments / Results  DIAGNOSTIC STUDIES:  Oxygen Saturation is 100% on RA, normal by my interpretation.    COORDINATION OF CARE:  11:11 PM Discussed treatment plan with pt at bedside and pt agreed to plan.   Labs (all labs ordered are listed, but only abnormal results are displayed) Labs Reviewed  URINALYSIS, [place], URINE  D-DIMER, QUANTITATIVE (NOT AT [facility])    EKG  EKG Interpretation None       Radiology No results found.  Procedures Procedures (including critical care time)  Medications Ordered in ED Medications - No data to display   Initial Impression / Assessment and Plan / ED Course  I have reviewed the triage vital signs and the nursing notes.  Pertinent labs & imaging results that were available during my care of the patient were reviewed by me and considered in my medical decision making (see chart for details).     Patient with intermittent right-sided low back pain denies falls or injury. Pain radiates into her right buttock. Denies loss of bowel or bladder control. Denies any urinary symptoms. Denies any focal weakness, numbness or tingling.  Neurologically intact. Low suspicion for cord compression or cauda equina. Urinalysis is negative. CXR negative. D-dimer negative.  Treat supportively for  musculo skeletal back pain with anti-inflammatories and steroids. Patient aware of elevated blood pressure. return precautions discussed  Final Clinical Impressions(s) / ED Diagnoses   Final diagnoses:  Low back pain with sciatica, sciatica laterality unspecified, unspecified back pain laterality, unspecified chronicity    New Prescriptions New Prescriptions   No medications on file  I personally performed the services described in this documentation, which was scribed in my presence. The recorded information has been reviewed and is accurate.     Jessica Essex, MD 12/25/16 [phone]

## 2016-12-24 NOTE — ED Notes (Signed)
Pt c/o right lower back pain worsening for the last week.  She also c/o sinus congestion as well.

## 2016-12-24 NOTE — ED Notes (Signed)
Patient transported to X-ray 

## 2016-12-24 NOTE — ED Triage Notes (Signed)
Patient reports that she has intermittent pain to her right lower back  - reports similar pain recently and was seen and treated for back pain. Patient reports that she is having pain radiating down her hip

## 2016-12-25 MED ORDER — NAPROXEN 500 MG PO TABS: 500.0000 mg | ORAL_TABLET | Freq: Two times a day (BID) | ORAL | 0 refills | Status: DC

## 2016-12-25 MED ORDER — METHYLPREDNISOLONE 4 MG PO TBPK: ORAL_TABLET | ORAL | 0 refills | Status: DC

## 2016-12-25 NOTE — Discharge Instructions (Signed)
Take the pain medication and steroids as prescribed. Followup with your doctor. Return to the ED if you develop new or worsening symptoms.

## 2016-12-25 NOTE — ED Notes (Signed)
Pt verbalizes understanding of d/c instructions and denies any further needs at this time. 

## 2017-03-01 DIAGNOSIS — Z0000 Encounter for general adult medical examination without abnormal findings: Secondary | ICD-10-CM | POA: Diagnosis not present

## 2017-03-06 DIAGNOSIS — I10 Essential (primary) hypertension: Secondary | ICD-10-CM | POA: Insufficient documentation

## 2017-03-06 DIAGNOSIS — Z0000 Encounter for general adult medical examination without abnormal findings: Secondary | ICD-10-CM | POA: Diagnosis not present

## 2017-03-06 DIAGNOSIS — Z3009 Encounter for other general counseling and advice on contraception: Secondary | ICD-10-CM | POA: Diagnosis not present

## 2017-04-02 DIAGNOSIS — I10 Essential (primary) hypertension: Secondary | ICD-10-CM | POA: Diagnosis not present

## 2017-04-02 DIAGNOSIS — F419 Anxiety disorder, unspecified: Secondary | ICD-10-CM | POA: Diagnosis not present

## 2017-05-15 ENCOUNTER — Ambulatory Visit: Payer: 59

## 2017-09-16 DIAGNOSIS — F419 Anxiety disorder, unspecified: Secondary | ICD-10-CM | POA: Diagnosis not present

## 2017-09-16 DIAGNOSIS — F32A Depression, unspecified: Secondary | ICD-10-CM | POA: Insufficient documentation

## 2017-09-16 DIAGNOSIS — R748 Abnormal levels of other serum enzymes: Secondary | ICD-10-CM | POA: Diagnosis not present

## 2017-09-16 DIAGNOSIS — F329 Major depressive disorder, single episode, unspecified: Secondary | ICD-10-CM | POA: Diagnosis not present

## 2017-09-16 DIAGNOSIS — Z3041 Encounter for surveillance of contraceptive pills: Secondary | ICD-10-CM | POA: Diagnosis not present

## 2017-09-16 DIAGNOSIS — I10 Essential (primary) hypertension: Secondary | ICD-10-CM | POA: Diagnosis not present

## 2017-10-15 DIAGNOSIS — I10 Essential (primary) hypertension: Secondary | ICD-10-CM | POA: Diagnosis not present

## 2017-10-21 ENCOUNTER — Other Ambulatory Visit: Payer: Self-pay | Admitting: Physician Assistant

## 2017-10-21 ENCOUNTER — Other Ambulatory Visit: Payer: Self-pay | Admitting: Family Medicine

## 2017-10-21 DIAGNOSIS — Z1231 Encounter for screening mammogram for malignant neoplasm of breast: Secondary | ICD-10-CM

## 2017-10-24 ENCOUNTER — Ambulatory Visit
Admission: RE | Admit: 2017-10-24 | Discharge: 2017-10-24 | Disposition: A | Discharge status: Home / Self Care | Payer: 59 | Source: Ambulatory Visit | Attending: Family Medicine | Admitting: Family Medicine

## 2017-10-24 DIAGNOSIS — Z1231 Encounter for screening mammogram for malignant neoplasm of breast: Secondary | ICD-10-CM

## 2018-06-03 DIAGNOSIS — Z3041 Encounter for surveillance of contraceptive pills: Secondary | ICD-10-CM | POA: Diagnosis not present

## 2018-06-03 DIAGNOSIS — I10 Essential (primary) hypertension: Secondary | ICD-10-CM | POA: Diagnosis not present

## 2018-06-09 DIAGNOSIS — Z3041 Encounter for surveillance of contraceptive pills: Secondary | ICD-10-CM | POA: Diagnosis not present

## 2018-06-23 DIAGNOSIS — H52223 Regular astigmatism, bilateral: Secondary | ICD-10-CM | POA: Diagnosis not present

## 2018-06-23 DIAGNOSIS — H5213 Myopia, bilateral: Secondary | ICD-10-CM | POA: Diagnosis not present

## 2018-06-23 DIAGNOSIS — H524 Presbyopia: Secondary | ICD-10-CM | POA: Diagnosis not present

## 2018-07-04 ENCOUNTER — Other Ambulatory Visit (INDEPENDENT_AMBULATORY_CARE_PROVIDER_SITE_OTHER): Payer: 59

## 2018-07-04 ENCOUNTER — Ambulatory Visit: Payer: 59 | Admitting: Family

## 2018-07-04 ENCOUNTER — Encounter: Payer: Self-pay | Admitting: Family

## 2018-07-04 ENCOUNTER — Other Ambulatory Visit: Payer: Self-pay | Admitting: Family

## 2018-07-04 VITALS — BP 146/84 | HR 84 | Temp 98.2°F | Ht 64.5 in | Wt 212.1 lb

## 2018-07-04 DIAGNOSIS — I10 Essential (primary) hypertension: Secondary | ICD-10-CM | POA: Diagnosis not present

## 2018-07-04 DIAGNOSIS — Z309 Encounter for contraceptive management, unspecified: Secondary | ICD-10-CM

## 2018-07-04 LAB — CBC WITH DIFFERENTIAL/PLATELET
BASOS ABS: 0.1 10*3/uL (ref 0.0–0.1)
BASOS PCT: 0.7 % (ref 0.0–3.0)
EOS ABS: 0.2 10*3/uL (ref 0.0–0.7)
Eosinophils Relative: 2.5 % (ref 0.0–5.0)
HEMATOCRIT: 39.7 % (ref 36.0–46.0)
HEMOGLOBIN: 13.2 g/dL (ref 12.0–15.0)
LYMPHS PCT: 25.5 % (ref 12.0–46.0)
Lymphs Abs: 2.4 10*3/uL (ref 0.7–4.0)
MCHC: 33.2 g/dL (ref 30.0–36.0)
MCV: 77.8 fl — ABNORMAL LOW (ref 78.0–100.0)
Monocytes Absolute: 0.8 10*3/uL (ref 0.1–1.0)
Monocytes Relative: 8 % (ref 3.0–12.0)
Neutro Abs: 6 10*3/uL (ref 1.4–7.7)
Neutrophils Relative %: 63.3 % (ref 43.0–77.0)
PLATELETS: 308 10*3/uL (ref 150.0–400.0)
RBC: 5.1 Mil/uL (ref 3.87–5.11)
RDW: 14.7 % (ref 11.5–15.5)
WBC: 9.5 10*3/uL (ref 4.0–10.5)

## 2018-07-04 LAB — COMPREHENSIVE METABOLIC PANEL
ALT: 16 U/L (ref 0–35)
AST: 18 U/L (ref 0–37)
Albumin: 4.1 g/dL (ref 3.5–5.2)
Alkaline Phosphatase: 45 U/L (ref 39–117)
BUN: 11 mg/dL (ref 6–23)
CO2: 28 mEq/L (ref 19–32)
Calcium: 9.4 mg/dL (ref 8.4–10.5)
Chloride: 101 mEq/L (ref 96–112)
Creatinine, Ser: 0.79 mg/dL (ref 0.40–1.20)
GFR: 101.59 mL/min (ref >60.00)
Glucose, Bld: 99 mg/dL (ref 70–99)
Potassium: 3.3 mEq/L — ABNORMAL LOW (ref 3.5–5.1)
Sodium: 138 mEq/L (ref 135–145)
Total Bilirubin: 0.4 mg/dL (ref 0.2–1.2)
Total Protein: 7.3 g/dL (ref 6.0–8.3)

## 2018-07-04 MED ORDER — LOSARTAN POTASSIUM 50 MG PO TABS
50.0000 mg | ORAL_TABLET | Freq: Every day | ORAL | 0 refills | Status: DC
Start: 2018-07-04 — End: 2018-08-26

## 2018-07-04 MED ORDER — POTASSIUM CHLORIDE ER 10 MEQ PO TBCR: 10.0000 meq | EXTENDED_RELEASE_TABLET | Freq: Every day | ORAL | 1 refills | Status: DC

## 2018-07-04 MED ORDER — LOSARTAN POTASSIUM 50 MG PO TABS: 50.0000 mg | ORAL_TABLET | Freq: Every day | ORAL | 0 refills | Status: DC

## 2018-07-04 NOTE — Progress Notes (Signed)
Jessica Griffin is a 42 y.o. female with the following history as recorded in EpicCare:  There are no active problems to display for this patient.   Current Outpatient Medications  Medication Sig Dispense Refill  . aspirin EC 81 MG tablet Take by mouth.    . chlorthalidone (HYGROTON) 50 MG tablet Take by mouth.    . DOCOSAHEXAENOIC ACID PO Take 1 g by mouth.    . loratadine (CLARITIN) 10 MG tablet Take by mouth.    . Multiple Vitamin (MULTIVITAMIN) capsule Take by mouth.    Marland Kitchen NIFEdipine (PROCARDIA-XL/ADALAT-CC/NIFEDICAL-XL) 30 MG 24 hr tablet   0  . norethindrone-ethinyl estradiol (JUNEL FE,GILDESS FE,LOESTRIN FE) 1-20 MG-MCG tablet   3  . vitamin B-12 (CYANOCOBALAMIN) 1000 MCG tablet Take by mouth.    . vitamin C (ASCORBIC ACID) 500 MG tablet Take by mouth.    . losartan (COZAAR) 50 MG tablet Take 1 tablet (50 mg total) by mouth daily. 90 tablet 0   No current facility-administered medications for this visit.     Allergies: Sulfa antibiotics  Past Medical History:  Diagnosis Date  . Hypertension     Past Surgical History:  Procedure Laterality Date  . TONSILLECTOMY      History reviewed. No pertinent family history.  Social History   Tobacco Use  . Smoking status: Never Smoker  . Smokeless tobacco: Never Used  Substance Use Topics  . Alcohol use: Yes    Subjective:  Patient presents today as a new patient; history of hypertension- currently on Chlorthalidone and Nifedipine; could not tolerate increased dosage of Nifedipine due to leg swelling; Denies any chest pain, shortness of breath, blurred vision or headache. Has been having increased leg cramps recently since Chlorthalidone dosage was increased at last OV by previous provider; occasional palpitations; started using OTC potassium supplement; Had a miscarriage in the past few years- was put on OCPs; does not have any interest in more pregnancies; not currently under GYN care.     Objective:  Vitals:   07/04/18  1007 07/04/18 1017  BP: (!) 150/98 (!) 146/84  Pulse: 84   Temp: 98.2 F (36.8 C)   TempSrc: Oral   SpO2: 97%   Weight: 212 lb 1.9 oz (96.2 kg)   Height: 5' 4.5" (1.638 m)     General: Well developed, well nourished, in no acute distress  Skin : Warm and dry.  Head: Normocephalic and atraumatic  Eyes: Sclera and conjunctiva clear; pupils round and reactive to light; extraocular movements intact  Ears: External normal; canals clear; tympanic membranes normal  Oropharynx: Pink, supple. No suspicious lesions  Neck: Supple without thyromegaly, adenopathy  Lungs: Respirations unlabored; clear to auscultation bilaterally without wheeze, rales, rhonchi  CVS exam: normal rate and regular rhythm.  Neurologic: Alert and oriented; speech intact; face symmetrical; moves all extremities well; CNII-XII intact without focal deficit   Assessment:  1. Essential hypertension   2. Encounter for contraceptive management, unspecified type     Plan:  1. Uncontrolled; continue current medications; add Losartan 50 mg; check CBC, CMP today; follow-up in 1 month, sooner prn. 2. Discussed concern about long-term use of OCPs with hypertension; she is interested in IUD; refer to GYN for further evaluation.   Return in about 1 month (around 08/04/2018).  Orders Placed This Encounter  Procedures  . CBC w/Diff    Standing Status:   Future    Number of Occurrences:   1    Standing Expiration Date:   07/04/2019  .  Comp Met (CMET)    Standing Status:   Future    Number of Occurrences:   1    Standing Expiration Date:   07/04/2019  . Ambulatory referral to Gynecology    Referral Priority:   Routine    Referral Type:   Consultation    Referral Reason:   Specialty Services Required    Requested Specialty:   Gynecology    Number of Visits Requested:   1    Requested Prescriptions   Signed Prescriptions Disp Refills  . losartan (COZAAR) 50 MG tablet 90 tablet 0    Sig: Take 1 tablet (50 mg total) by mouth  daily.

## 2018-07-14 ENCOUNTER — Encounter: Payer: Self-pay | Admitting: Family

## 2018-07-14 NOTE — Telephone Encounter (Signed)
Jessica Griffin is out of the office this week pls advise on email.Marland KitchenJohny Griffin

## 2018-08-08 ENCOUNTER — Ambulatory Visit: Payer: 59 | Admitting: Family

## 2018-08-08 ENCOUNTER — Other Ambulatory Visit: Payer: Self-pay | Admitting: Family

## 2018-08-08 ENCOUNTER — Encounter: Payer: Self-pay | Admitting: Family

## 2018-08-08 ENCOUNTER — Other Ambulatory Visit (INDEPENDENT_AMBULATORY_CARE_PROVIDER_SITE_OTHER): Payer: 59

## 2018-08-08 VITALS — BP 130/88 | HR 95 | Temp 98.4°F | Ht 64.5 in | Wt 216.1 lb

## 2018-08-08 DIAGNOSIS — F418 Other specified anxiety disorders: Secondary | ICD-10-CM | POA: Diagnosis not present

## 2018-08-08 DIAGNOSIS — E876 Hypokalemia: Secondary | ICD-10-CM | POA: Diagnosis not present

## 2018-08-08 DIAGNOSIS — I10 Essential (primary) hypertension: Secondary | ICD-10-CM

## 2018-08-08 DIAGNOSIS — Z23 Encounter for immunization: Secondary | ICD-10-CM

## 2018-08-08 LAB — BASIC METABOLIC PANEL
BUN: 20 mg/dL (ref 6–23)
CHLORIDE: 100 meq/L (ref 96–112)
CO2: 29 mEq/L (ref 19–32)
Calcium: 9.5 mg/dL (ref 8.4–10.5)
Creatinine, Ser: 0.75 mg/dL (ref 0.40–1.20)
GFR: 107.82 mL/min (ref >60.00)
Glucose, Bld: 95 mg/dL (ref 70–99)
POTASSIUM: 3 meq/L — AB (ref 3.5–5.1)
Sodium: 140 mEq/L (ref 135–145)

## 2018-08-08 MED ORDER — LORAZEPAM 0.5 MG PO TABS: 0.5000 mg | ORAL_TABLET | Freq: Every day | ORAL | 0 refills | Status: DC | PRN

## 2018-08-08 NOTE — Patient Instructions (Signed)
Increase the Losartan to 100 mg daily ( take 2 of the medication you have);

## 2018-08-08 NOTE — Progress Notes (Signed)
Jessica Griffin is a 42 y.o. female with the following history as recorded in EpicCare:  There are no active problems to display for this patient.   Current Outpatient Medications  Medication Sig Dispense Refill  . aspirin EC 81 MG tablet Take by mouth.    . chlorthalidone (HYGROTON) 50 MG tablet Take by mouth.    . DOCOSAHEXAENOIC ACID PO Take 1 g by mouth.    . loratadine (CLARITIN) 10 MG tablet Take by mouth.    . losartan (COZAAR) 50 MG tablet Take 1 tablet (50 mg total) by mouth daily. 90 tablet 0  . Multiple Vitamin (MULTIVITAMIN) capsule Take by mouth.    Marland Kitchen NIFEdipine (PROCARDIA-XL/ADALAT-CC/NIFEDICAL-XL) 30 MG 24 hr tablet   0  . norethindrone-ethinyl estradiol (JUNEL FE,GILDESS FE,LOESTRIN FE) 1-20 MG-MCG tablet   3  . potassium chloride (K-DUR) 10 MEQ tablet Take 1 tablet (10 mEq total) by mouth daily. 30 tablet 1  . vitamin B-12 (CYANOCOBALAMIN) 1000 MCG tablet Take by mouth.    . vitamin C (ASCORBIC ACID) 500 MG tablet Take by mouth.    Marland Kitchen LORazepam (ATIVAN) 0.5 MG tablet Take 1 tablet (0.5 mg total) by mouth daily as needed for anxiety. 30 tablet 0   No current facility-administered medications for this visit.     Allergies: Sulfa antibiotics  Past Medical History:  Diagnosis Date  . Hypertension     Past Surgical History:  Procedure Laterality Date  . TONSILLECTOMY    . TONSILLECTOMY AND ADENOIDECTOMY     Patient states surgery 1996 or 1997    Family History  Problem Relation Age of Onset  . Arthritis Mother   . Miscarriages / Stillbirths Sister   . Arthritis Maternal Grandmother   . Diabetes Maternal Grandmother   . Diabetes Maternal Grandfather   . Hearing loss Maternal Grandfather   . Heart disease Maternal Grandfather   . High blood pressure Maternal Grandfather   . Stroke Maternal Grandfather     Social History   Tobacco Use  . Smoking status: Never Smoker  . Smokeless tobacco: Never Used  Substance Use Topics  . Alcohol use: Yes     Subjective:  Patient presents today for 1 month follow-up on hypertension; at last office visit, Losartan was added to her Chlorthalidone and Nifedipine; tolerating well; was also put on potassium supplement daily;  Will be seeing GYN later this month for evaluation for IUD; would prefer to get patient off OCPs; Requesting refill on her Ativan to help with situational anxiety; has had prescription for years; does not feel that she needs daily medication; Would like to get flu shot today;   Objective:  Vitals:   08/08/18 1049  BP: 130/88  Pulse: 95  Temp: 98.4 F (36.9 C)  TempSrc: Oral  SpO2: 96%  Weight: 216 lb 1.3 oz (98 kg)  Height: 5' 4.5" (1.638 m)    General: Well developed, well nourished, in no acute distress  Skin : Warm and dry.  Head: Normocephalic and atraumatic  Eyes: Sclera and conjunctiva clear; pupils round and reactive to light; extraocular movements intact  Ears: External normal; canals clear; tympanic membranes normal  Oropharynx: Pink, supple. No suspicious lesions  Neck: Supple without thyromegaly, adenopathy  Lungs: Respirations unlabored; clear to auscultation bilaterally without wheeze, rales, rhonchi  CVS exam: normal rate and regular rhythm.  Neurologic: Alert and oriented; speech intact; face symmetrical; moves all extremities well; CNII-XII intact without focal deficit   Assessment:  1. Essential hypertension  2. Hypokalemia   3. Situational anxiety   4. Need for influenza vaccination     Plan:  1. Improved but still not at goal; patient will increase her Losartan 100 mg daily; she had called about a rash on this drug but has determined that Losartan was not the source; continue other medications; call back next week with readings; 2. Check BMP today; continue potassium; 3. Rx for Ativan 0.5 mg qd prn #30/ 0 rf; 4. Flu shot given today;   No follow-ups on file.  Orders Placed This Encounter  Procedures  . Flu Vaccine QUAD 36+ mos IM  .  Basic Metabolic Panel (BMET)    Standing Status:   Future    Number of Occurrences:   1    Standing Expiration Date:   08/08/2019    Requested Prescriptions   Signed Prescriptions Disp Refills  . LORazepam (ATIVAN) 0.5 MG tablet 30 tablet 0    Sig: Take 1 tablet (0.5 mg total) by mouth daily as needed for anxiety.

## 2018-08-11 ENCOUNTER — Other Ambulatory Visit: Payer: Self-pay | Admitting: Family

## 2018-08-11 MED ORDER — NORETHIN ACE-ETH ESTRAD-FE 1-20 MG-MCG PO TABS: 1.0000 | ORAL_TABLET | Freq: Every day | ORAL | 0 refills | Status: DC

## 2018-08-11 MED ORDER — POTASSIUM CHLORIDE ER 10 MEQ PO TBCR
10.0000 meq | EXTENDED_RELEASE_TABLET | Freq: Two times a day (BID) | ORAL | 1 refills | Status: DC
Start: 2018-08-11 — End: 2018-10-14

## 2018-08-21 ENCOUNTER — Encounter: Payer: Self-pay | Admitting: Obstetrics & Gynecology

## 2018-08-21 ENCOUNTER — Encounter: Payer: Self-pay | Admitting: Family

## 2018-08-21 ENCOUNTER — Other Ambulatory Visit (INDEPENDENT_AMBULATORY_CARE_PROVIDER_SITE_OTHER): Payer: 59

## 2018-08-21 ENCOUNTER — Ambulatory Visit (INDEPENDENT_AMBULATORY_CARE_PROVIDER_SITE_OTHER): Payer: 59 | Admitting: Obstetrics & Gynecology

## 2018-08-21 ENCOUNTER — Ambulatory Visit: Payer: 59 | Admitting: Family

## 2018-08-21 VITALS — BP 130/84 | Ht 64.5 in | Wt 218.0 lb

## 2018-08-21 DIAGNOSIS — E876 Hypokalemia: Secondary | ICD-10-CM

## 2018-08-21 DIAGNOSIS — Z3009 Encounter for other general counseling and advice on contraception: Secondary | ICD-10-CM

## 2018-08-21 DIAGNOSIS — I10 Essential (primary) hypertension: Secondary | ICD-10-CM

## 2018-08-21 LAB — BASIC METABOLIC PANEL
BUN: 14 mg/dL (ref 6–23)
CALCIUM: 8.9 mg/dL (ref 8.4–10.5)
CO2: 27 mEq/L (ref 19–32)
Chloride: 103 mEq/L (ref 96–112)
Creatinine, Ser: 0.78 mg/dL (ref 0.40–1.20)
GFR: 103.03 mL/min (ref >60.00)
Glucose, Bld: 142 mg/dL — ABNORMAL HIGH (ref 70–99)
Potassium: 3 mEq/L — ABNORMAL LOW (ref 3.5–5.1)
Sodium: 140 mEq/L (ref 135–145)

## 2018-08-21 NOTE — Patient Instructions (Addendum)
  1. Encounter for other general counseling or advice on contraception 42 year old woman with chronic hypertension on nifedipine and losartan and body mass index at 36.84, on birth control pill norethindrone-ethinylestradiol FE 1/20 for contraception.  A contraceptive without estrogen would probably make it easier to control her chronic hypertension and would decrease her risk of blood clots/strokes.  Patient is interested in the progesterone IUD.  Advantages and risks associated with the progesterone IUD, as well as the insertion procedure thoroughly reviewed with patient.  Decision to proceed with that contraceptive method. Patient will follow-up for insertion of a Mirena IUD.  Recommend continuing on the birth control pill until the IUD insertion.  2. Chronic hypertension On Nifedipine and Losartan.  Jessica Griffin, it was a pleasure meeting you today!  I will see you again soon for the Mirena IUD insertion.

## 2018-08-21 NOTE — Progress Notes (Signed)
    Jessica Griffin Aug 15, 1976 [id number]        42 y.o.  G2P1A1L1  RP: Referred for Progesterone IUD contraception  HPI: Chronic hypertension on 2 medications, nifedipine and losartan.  Using birth control pill for contraception norethindrone ethinyl estradiol FE 1/20.  Referred by Jessica Griffin her internist who recommended coming off the birth control pills and considering IUD contraception instead.  Patient with no pelvic pain, normal light periods on the birth control pill with no breakthrough bleeding.  Body mass index 36.84.  Non-smoker.   OB History  Gravida Para Term Preterm AB Living  2 1       1   SAB TAB Ectopic Multiple Live Births               # Outcome Date GA Lbr Len/2nd Weight Sex Delivery Anes PTL Lv  2 Gravida           1 Para             Past medical history,surgical history, problem list, medications, allergies, family history and social history were all reviewed and documented in the EPIC chart.   Directed ROS with pertinent positives and negatives documented in the history of present illness/assessment and plan.  Exam:  Vitals:   08/21/18 1206  BP: 130/84  Weight: 218 lb (98.9 kg)  Height: 5' 4.5" (1.638 m)   General appearance:  Normal  Gynecologic exam deferred.   Assessment/Plan:  42 y.o. G2P1   1. Encounter for other general counseling or advice on contraception 42 year old woman with chronic hypertension on nifedipine and losartan and body mass index at 36.84, on birth control pill norethindrone-ethinylestradiol FE 1/20 for contraception.  A contraceptive without estrogen would probably make it easier to control her chronic hypertension and would decrease her risk of blood clots/strokes.  Patient is interested in the progesterone IUD.  Advantages and risks associated with the progesterone IUD, as well as the insertion procedure thoroughly reviewed with patient.  Decision to proceed with that contraceptive method.  Patient will follow-up for  insertion of a Mirena IUD.  Recommend continuing on the birth control pill until the IUD insertion.  2. Chronic hypertension On Nifedipine and Losartan.  Counseling on above issues and coordination of care more than 50% for 30 minutes.  Jessica Bruins MD, 12:16 PM 08/21/2018

## 2018-08-26 ENCOUNTER — Other Ambulatory Visit: Payer: Self-pay | Admitting: Family

## 2018-08-26 MED ORDER — LOSARTAN POTASSIUM 100 MG PO TABS: 100.0000 mg | ORAL_TABLET | Freq: Every day | ORAL | 0 refills | Status: DC

## 2018-08-26 MED ORDER — HYDROCHLOROTHIAZIDE 25 MG PO TABS: 25.0000 mg | ORAL_TABLET | Freq: Every day | ORAL | 0 refills | Status: DC

## 2018-08-26 NOTE — Telephone Encounter (Signed)
Let's have her stop the Chlorthalidone due to potassium concerns; Stay on the Losartan 100 mg and Nifedipine XL 30 mg;  I see that she has tried Triam/ HCTZ in the past- I know she didn't like how she felt on it but there were no rashes correct. I want to try just the HCTZ part to replace the Chlorthalidone but need to verify the reason she stopped the Triam/ HCTZ.

## 2018-09-09 ENCOUNTER — Ambulatory Visit (INDEPENDENT_AMBULATORY_CARE_PROVIDER_SITE_OTHER): Payer: 59 | Admitting: Obstetrics & Gynecology

## 2018-09-09 ENCOUNTER — Encounter: Payer: Self-pay | Admitting: Obstetrics & Gynecology

## 2018-09-09 VITALS — BP 130/85

## 2018-09-09 DIAGNOSIS — Z30430 Encounter for insertion of intrauterine contraceptive device: Secondary | ICD-10-CM | POA: Diagnosis not present

## 2018-09-09 DIAGNOSIS — I10 Essential (primary) hypertension: Secondary | ICD-10-CM

## 2018-09-09 NOTE — Progress Notes (Signed)
    Jessica Griffin Nov 24, 1976 [id number]        42 y.o.  G2P1   RP: Mirena IUD insertion  HPI: Continued on BCPs until Mirena insertion day today.  No Pelvic pain.  cHTN on medication.   OB History  Gravida Para Term Preterm AB Living  2 1       1   SAB TAB Ectopic Multiple Live Births               # Outcome Date GA Lbr Len/2nd Weight Sex Delivery Anes PTL Lv  2 Gravida           1 Para             Past medical history,surgical history, problem list, medications, allergies, family history and social history were all reviewed and documented in the EPIC chart.   Directed ROS with pertinent positives and negatives documented in the history of present illness/assessment and plan.  Exam:  Vitals:   09/09/18 1156  BP: 130/85   General appearance:  Normal                                                                    IUD procedure note       Patient presented to the office today for placement of Mirena IUD. The patient had previously been provided with literature information on this method of contraception. The risks benefits and pros and cons were discussed and all her questions were answered. She is fully aware that this form of contraception is 99% effective and is good for 5 years.  Pelvic exam: Vulva normal Vagina: No lesions or discharge Cervix: No lesions or discharge Uterus: AV position Adnexa: No masses or tenderness Rectal exam: Not done  The cervix was cleansed with Betadine solution. Hurricane spray on the cervix.  A single-tooth tenaculum was placed on the anterior cervical lip. The IUD was shown to the patient and inserted in a sterile fashion.  Hysterometry with the IUD as being inserted was 8 cm.  The IUD string was trimmed. The single-tooth tenaculum was removed. Patient was instructed to return back to the office in one month for follow up.        Assessment/Plan:  41 y.o. G2P1   1. Encounter for IUD insertion Easy insertion of the Mirena IUD.   Well-tolerated and no complication.  Precautions discussed with patient.  She will follow-up in 4 weeks for IUD check.  2. Chronic hypertension Stop birth control pill now.  [name] MD, 12:00 PM 09/09/2018

## 2018-09-12 ENCOUNTER — Encounter: Payer: Self-pay | Admitting: Anesthesiology

## 2018-09-14 ENCOUNTER — Encounter: Payer: Self-pay | Admitting: Obstetrics & Gynecology

## 2018-09-14 NOTE — Patient Instructions (Signed)
1. Encounter for IUD insertion Easy insertion of the Mirena IUD.  Well-tolerated and no complication.  Precautions discussed with patient.  She will follow-up in 4 weeks for IUD check.  2. Chronic hypertension Stop birth control pill now.  Jessica Griffin, it was a pleasure seeing you today!

## 2018-09-24 ENCOUNTER — Encounter (INDEPENDENT_AMBULATORY_CARE_PROVIDER_SITE_OTHER): Payer: Self-pay

## 2018-09-26 ENCOUNTER — Other Ambulatory Visit: Payer: Self-pay | Admitting: Family

## 2018-09-26 MED ORDER — LOSARTAN POTASSIUM 100 MG PO TABS: 100.0000 mg | ORAL_TABLET | Freq: Every day | ORAL | 1 refills | Status: DC

## 2018-09-26 NOTE — Telephone Encounter (Signed)
I can see that blood pressure has looked much better at recent GYN visits; but still need her to get her potassium level re-checked. Lab order in place- thanks

## 2018-09-26 NOTE — Telephone Encounter (Signed)
Message sent to patient via mychart

## 2018-10-09 ENCOUNTER — Encounter: Payer: Self-pay | Admitting: Family

## 2018-10-09 ENCOUNTER — Other Ambulatory Visit: Payer: Self-pay | Admitting: Family

## 2018-10-09 DIAGNOSIS — Z1231 Encounter for screening mammogram for malignant neoplasm of breast: Secondary | ICD-10-CM

## 2018-10-13 ENCOUNTER — Other Ambulatory Visit: Payer: Self-pay | Admitting: Family

## 2018-10-13 ENCOUNTER — Encounter: Payer: Self-pay | Admitting: Obstetrics & Gynecology

## 2018-10-13 ENCOUNTER — Other Ambulatory Visit: Payer: Self-pay | Admitting: Obstetrics & Gynecology

## 2018-10-13 ENCOUNTER — Encounter: Payer: Self-pay | Admitting: Family

## 2018-10-13 ENCOUNTER — Other Ambulatory Visit (INDEPENDENT_AMBULATORY_CARE_PROVIDER_SITE_OTHER): Payer: 59

## 2018-10-13 ENCOUNTER — Ambulatory Visit: Payer: 59 | Admitting: Obstetrics & Gynecology

## 2018-10-13 ENCOUNTER — Ambulatory Visit (INDEPENDENT_AMBULATORY_CARE_PROVIDER_SITE_OTHER): Payer: 59

## 2018-10-13 VITALS — BP 130/84

## 2018-10-13 DIAGNOSIS — N852 Hypertrophy of uterus: Secondary | ICD-10-CM

## 2018-10-13 DIAGNOSIS — T8332XA Displacement of intrauterine contraceptive device, initial encounter: Secondary | ICD-10-CM

## 2018-10-13 DIAGNOSIS — Z30431 Encounter for routine checking of intrauterine contraceptive device: Secondary | ICD-10-CM

## 2018-10-13 DIAGNOSIS — E876 Hypokalemia: Secondary | ICD-10-CM

## 2018-10-13 DIAGNOSIS — N838 Other noninflammatory disorders of ovary, fallopian tube and broad ligament: Secondary | ICD-10-CM

## 2018-10-13 DIAGNOSIS — D251 Intramural leiomyoma of uterus: Secondary | ICD-10-CM | POA: Diagnosis not present

## 2018-10-13 LAB — COMPREHENSIVE METABOLIC PANEL
ALBUMIN: 4.1 g/dL (ref 3.5–5.2)
ALT: 22 U/L (ref 0–35)
AST: 19 U/L (ref 0–37)
Alkaline Phosphatase: 44 U/L (ref 39–117)
BILIRUBIN TOTAL: 0.3 mg/dL (ref 0.2–1.2)
BUN: 12 mg/dL (ref 6–23)
CO2: 28 mEq/L (ref 19–32)
CREATININE: 0.71 mg/dL (ref 0.40–1.20)
Calcium: 9.3 mg/dL (ref 8.4–10.5)
Chloride: 101 mEq/L (ref 96–112)
GFR: 114.76 mL/min (ref >60.00)
Glucose, Bld: 97 mg/dL (ref 70–99)
Potassium: 3.3 mEq/L — ABNORMAL LOW (ref 3.5–5.1)
Sodium: 137 mEq/L (ref 135–145)
Total Protein: 7 g/dL (ref 6.0–8.3)

## 2018-10-13 NOTE — Telephone Encounter (Signed)
Pt is in the lab and wanted to know if you wanted her K+ to be rechecked.

## 2018-10-13 NOTE — Progress Notes (Signed)
    Jessica Griffin 1975-12-02 [id number]        42 y.o.  G2P1A1L1  RP: IUD check after insertion  HPI: Had vaginal spotting x 2 weeks, no vaginal bleeding anymore.  No pelvic pain currently but had episodes of intermittent sharp pains.  No pain with intercourse.  Normal vaginal secretions.  No fever.   OB History  Gravida Para Term Preterm AB Living  2 1       1   SAB TAB Ectopic Multiple Live Births               # Outcome Date GA Lbr Len/2nd Weight Sex Delivery Anes PTL Lv  2 Gravida           1 Para             Past medical history,surgical history, problem list, medications, allergies, family history and social history were all reviewed and documented in the EPIC chart.   Directed ROS with pertinent positives and negatives documented in the history of present illness/assessment and plan.  Exam:  Vitals:   10/13/18 1233  BP: 130/84   General appearance:  Normal  Abdomen: Normal  Gynecologic exam: Vulva normal.  Speculum:  Cervix normal.  IUD strings not visible.  Vagina normal.  Secretions normal.  Bimanual exam:  Uterus AV, normal volume/mobile, NT.  IUD strings not felt.  No adnexal mass/NT bilaterally.  Pelvic US today: T/V and T/a images.  Anteverted uterus enlarged with fibroids measuring 11.44 x 9.17 x 7.36 cm.  Endometrial lining normal at 8.1 mm.  IUD seen in normal intrauterine position with the strings seen in the cervical area.  Intramural fibroids measuring 3.4 x 3.0 cm, 3.8 x 2.7 cm, 2.5 x 2.2 cm, 2.3 x 2.1 cm, 3.0 x 2.4 cm, 4.8 x 3.8 cm.  Left ovary normal.  Right ovary with a hypoechoic cyst with internal low-level echoes and negative color flow Doppler measuring 3.1 x 2.6 x 2.7 cm.  No free fluid in the posterior cul-de-sac.   Assessment/Plan:  42 y.o. G2P1   1. IUD check up Mirena IUD well tolerated with no sign of infection.  IUD in good IU position confirmed by [place] as the strings were not visible on exam.  Pelvic ultrasound findings reviewed with  patient and reassurance given - US Transvaginal Non-OB  2. Intrauterine contraceptive device threads lost, initial encounter As above, Mirena IUD in good IU position confirmed by [place]. - US Transvaginal Non-OB  Counseling on above issues and coordination of care >50% x 15 minutes.  [name] MD, 12:38 PM 10/13/2018

## 2018-10-14 ENCOUNTER — Other Ambulatory Visit: Payer: Self-pay | Admitting: Family

## 2018-10-14 MED ORDER — LOSARTAN POTASSIUM-HCTZ 100-25 MG PO TABS: 1.0000 | ORAL_TABLET | Freq: Every day | ORAL | 1 refills | Status: DC

## 2018-10-14 MED ORDER — POTASSIUM CHLORIDE ER 20 MEQ PO TBCR: 20.0000 meq | EXTENDED_RELEASE_TABLET | Freq: Every day | ORAL | 1 refills | Status: DC

## 2018-10-14 NOTE — Telephone Encounter (Signed)
Please advise 

## 2018-10-19 ENCOUNTER — Encounter: Payer: Self-pay | Admitting: Obstetrics & Gynecology

## 2018-10-19 NOTE — Patient Instructions (Signed)
1. IUD check up Mirena IUD well tolerated with no sign of infection.  IUD in good IU position confirmed by [place] as the strings were not visible on exam.  Pelvic ultrasound findings reviewed with patient and reassurance given - US Transvaginal Non-OB  2. Intrauterine contraceptive device threads lost, initial encounter As above, Mirena IUD in good IU position confirmed by [place]. - US Transvaginal Non-OB  Jessica Griffin, it was a pleasure seeing you today!

## 2018-11-18 ENCOUNTER — Ambulatory Visit
Admission: RE | Admit: 2018-11-18 | Discharge: 2018-11-18 | Disposition: A | Discharge status: Home / Self Care | Payer: 59 | Source: Ambulatory Visit | Attending: Family | Admitting: Family

## 2018-11-18 DIAGNOSIS — Z1231 Encounter for screening mammogram for malignant neoplasm of breast: Secondary | ICD-10-CM

## 2018-12-26 ENCOUNTER — Telehealth: Payer: 59 | Admitting: Family

## 2018-12-26 DIAGNOSIS — J019 Acute sinusitis, unspecified: Secondary | ICD-10-CM

## 2018-12-26 DIAGNOSIS — B9789 Other viral agents as the cause of diseases classified elsewhere: Secondary | ICD-10-CM | POA: Diagnosis not present

## 2018-12-26 MED ORDER — FLUTICASONE PROPIONATE 50 MCG/ACT NA SUSP: 2.0000 | Freq: Every day | NASAL | 0 refills | Status: DC

## 2018-12-26 NOTE — Progress Notes (Signed)

## 2018-12-31 ENCOUNTER — Other Ambulatory Visit: Payer: Self-pay | Admitting: Family

## 2018-12-31 ENCOUNTER — Encounter: Payer: Self-pay | Admitting: Family

## 2018-12-31 MED ORDER — LOSARTAN POTASSIUM-HCTZ 100-25 MG PO TABS: 1.0000 | ORAL_TABLET | Freq: Every day | ORAL | 0 refills | Status: DC

## 2019-01-21 ENCOUNTER — Other Ambulatory Visit: Payer: Self-pay | Admitting: Family

## 2019-04-07 IMAGING — MG DIGITAL SCREENING BILATERAL MAMMOGRAM WITH TOMO AND CAD
6 of 10 series · 6 of 30 positions shown · non-contrast
Comparison: Previous exam(s).

CLINICAL DATA: Screening.

EXAM:
DIGITAL SCREENING BILATERAL MAMMOGRAM WITH TOMO AND CAD

[L MLO synth-2D]
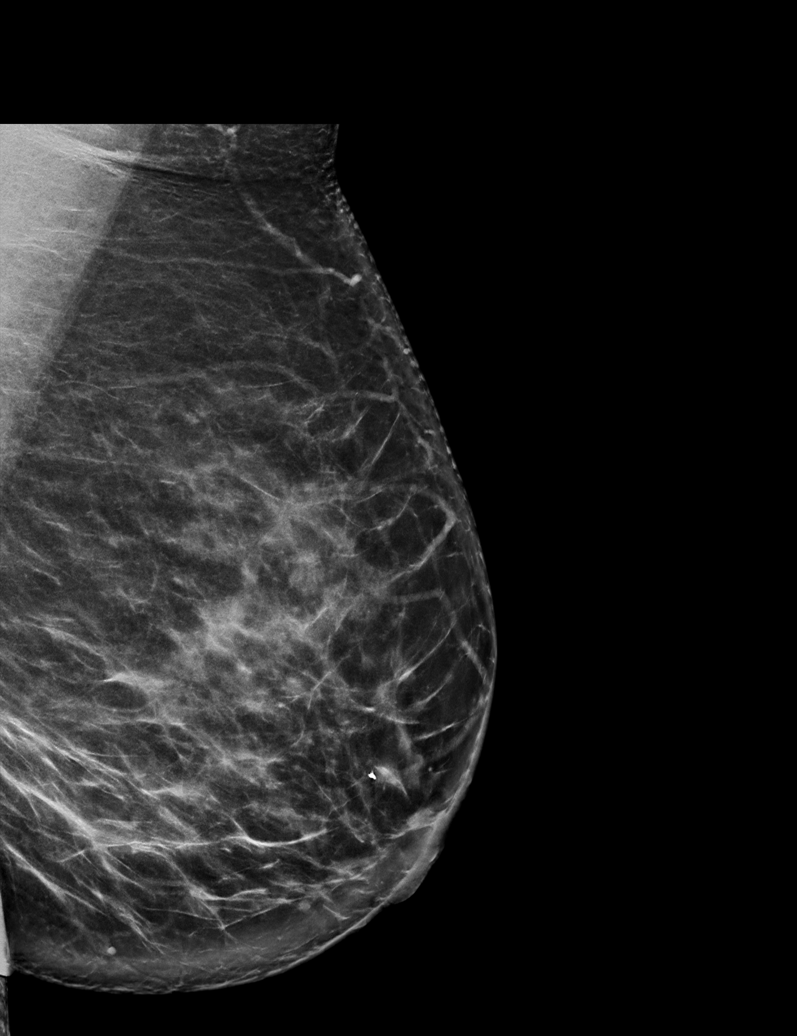

[L CC synth-2D]
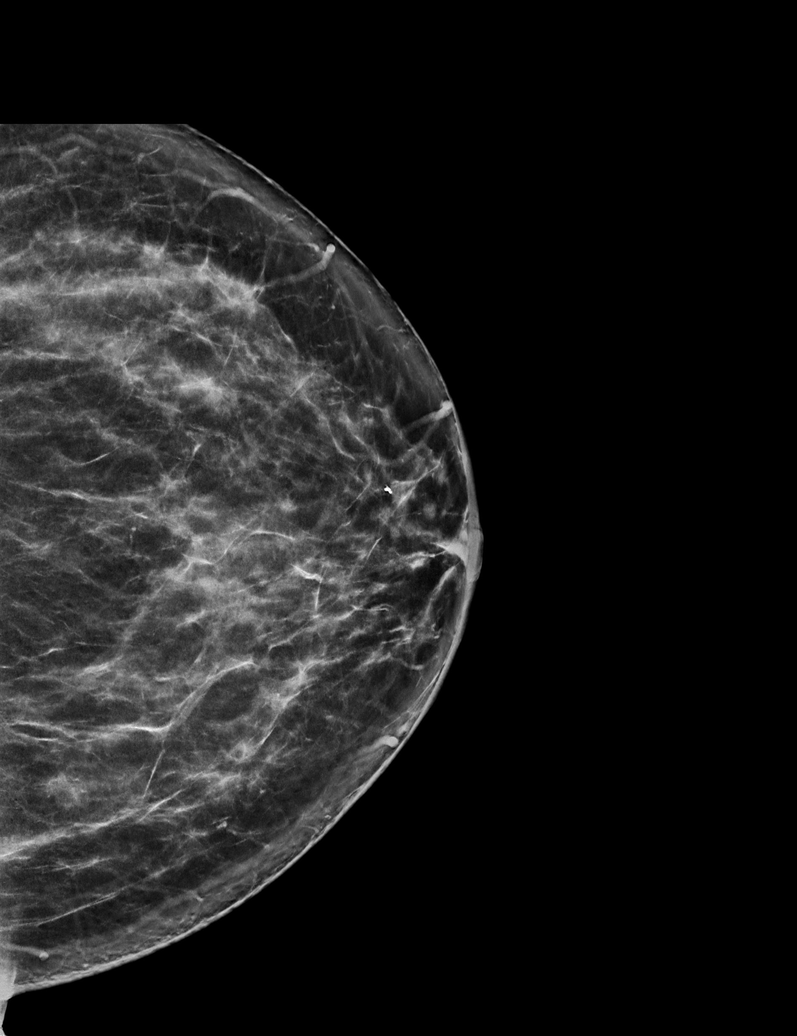

[R MLO synth-2D (1 of 2)]
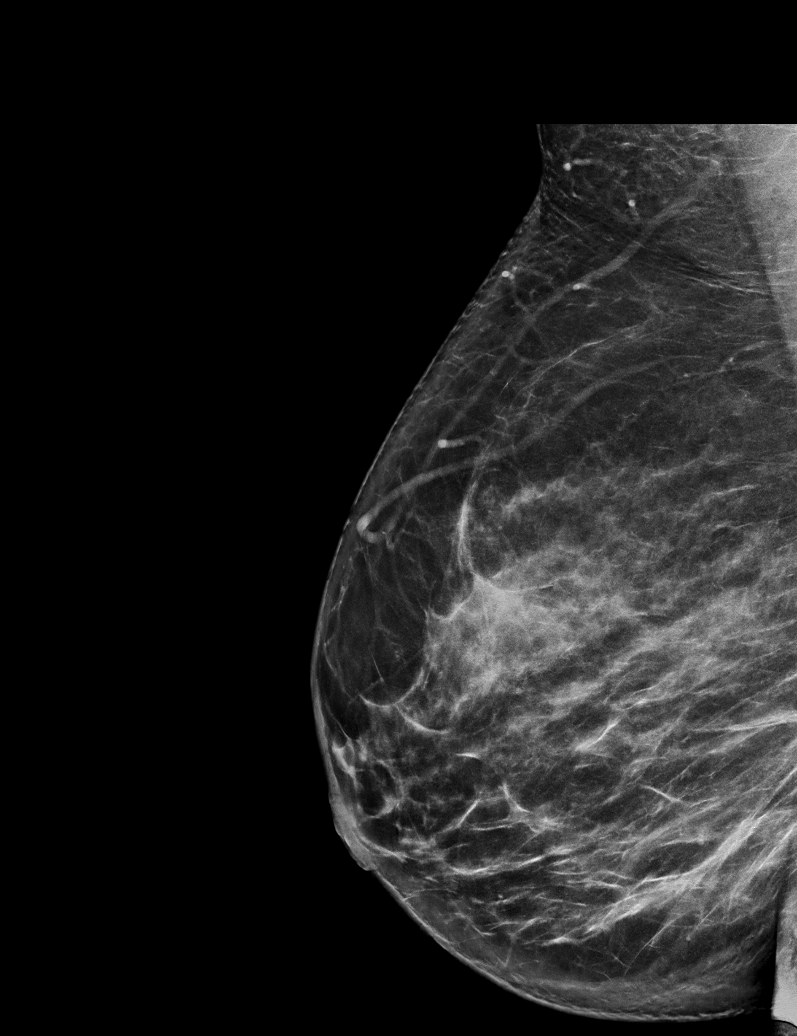

[R MLO synth-2D (2 of 2)]
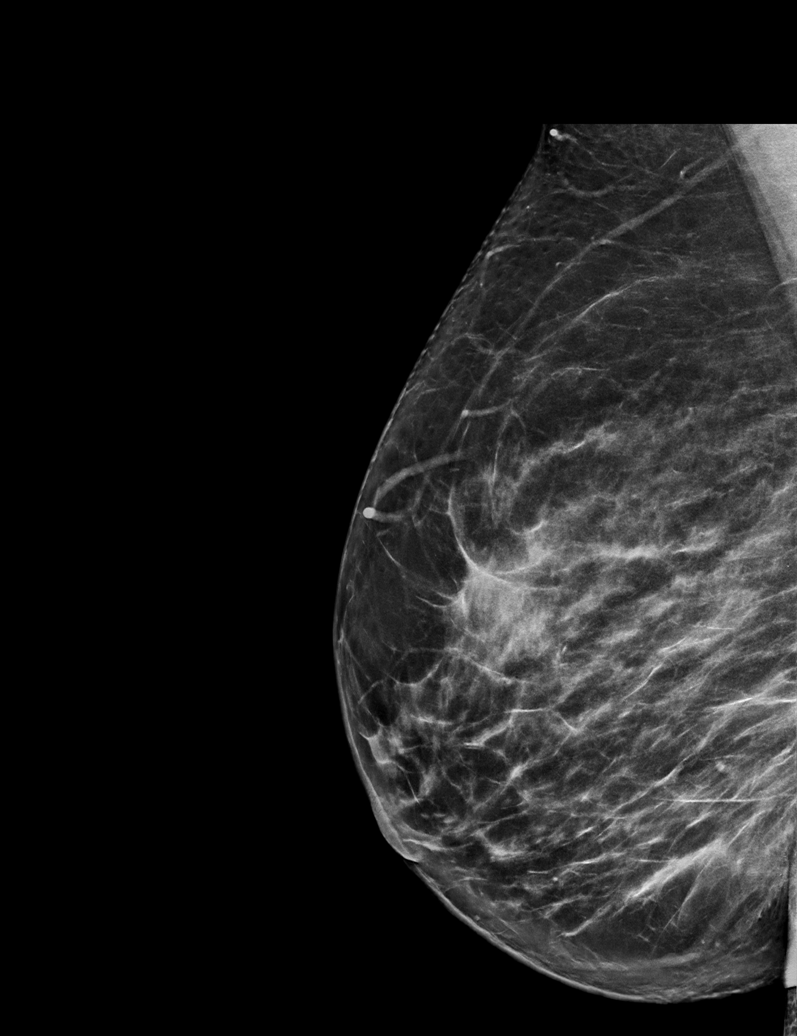

[R CC synth-2D]
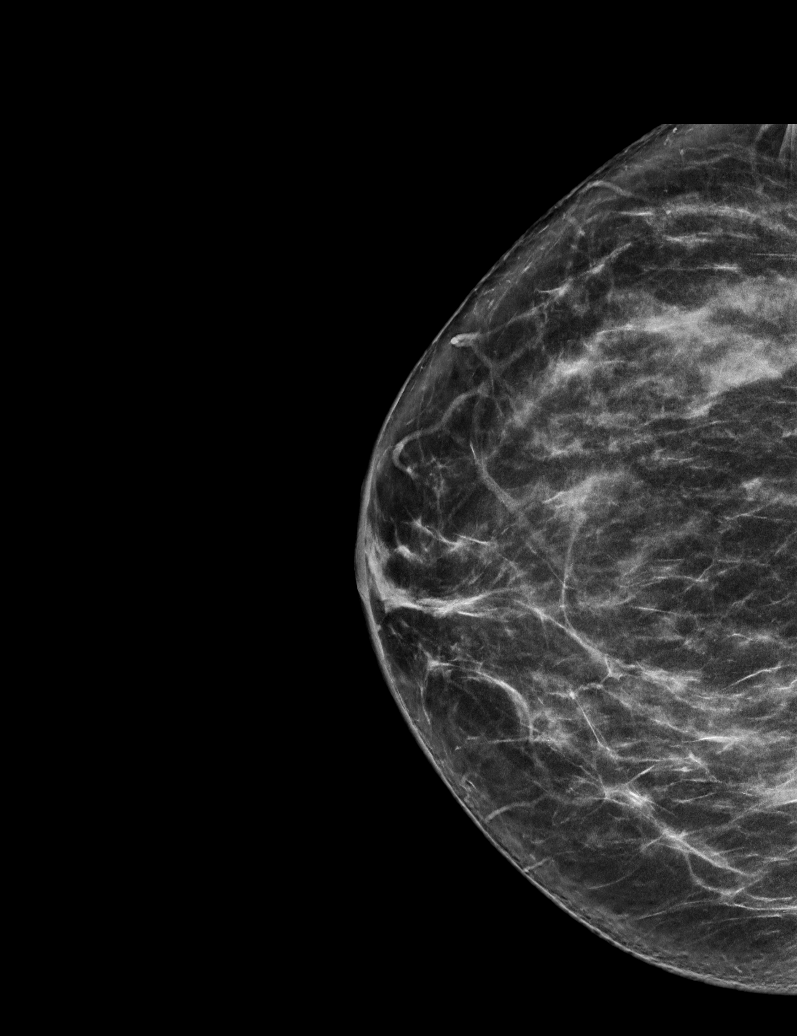

[L MLO tomo · tomo slice 43/85.0]
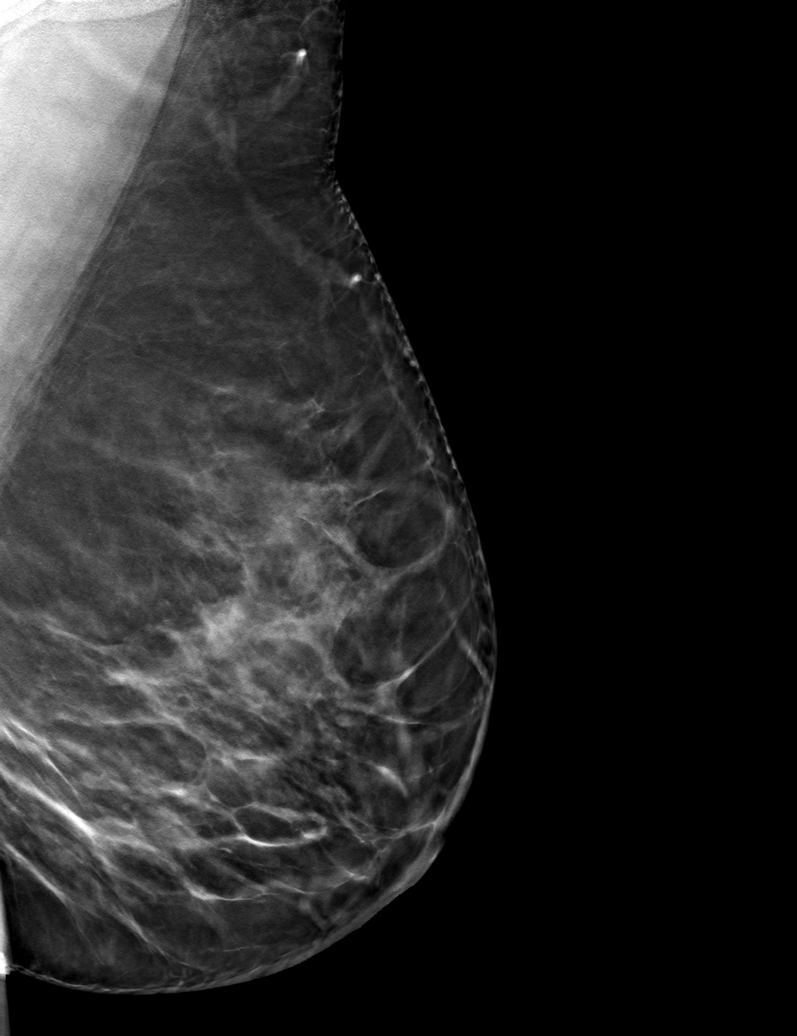

[6 of 30 positions shown; findings below may reference images not displayed]

ACR Breast Density Category c: The breast tissue is heterogeneously
dense, which may obscure small masses.
FINDINGS: There are no findings suspicious for malignancy. Images were
processed with CAD.
IMPRESSION: No mammographic evidence of malignancy. A result letter of this
screening mammogram will be mailed directly to the patient.

RECOMMENDATION:
Screening mammogram in one year. (Code:FT-U-LHB)

BI-RADS CATEGORY  1: Negative.

## 2019-04-27 ENCOUNTER — Ambulatory Visit (INDEPENDENT_AMBULATORY_CARE_PROVIDER_SITE_OTHER): Payer: 59 | Admitting: Family

## 2019-04-27 ENCOUNTER — Encounter: Payer: Self-pay | Admitting: Family

## 2019-04-27 DIAGNOSIS — E876 Hypokalemia: Secondary | ICD-10-CM

## 2019-04-27 DIAGNOSIS — F418 Other specified anxiety disorders: Secondary | ICD-10-CM | POA: Diagnosis not present

## 2019-04-27 DIAGNOSIS — I10 Essential (primary) hypertension: Secondary | ICD-10-CM

## 2019-04-27 MED ORDER — POTASSIUM CHLORIDE ER 20 MEQ PO TBCR: 20.0000 meq | EXTENDED_RELEASE_TABLET | Freq: Every day | ORAL | 1 refills | Status: DC

## 2019-04-27 MED ORDER — LORAZEPAM 0.5 MG PO TABS: 0.5000 mg | ORAL_TABLET | Freq: Every day | ORAL | 0 refills | Status: DC | PRN

## 2019-04-27 MED ORDER — LOSARTAN POTASSIUM-HCTZ 100-25 MG PO TABS: 1.0000 | ORAL_TABLET | Freq: Every day | ORAL | 0 refills | Status: DC

## 2019-04-27 NOTE — Progress Notes (Signed)
Jessica Griffin is a 43 y.o. female with the following history as recorded in EpicCare:  There are no active problems to display for this patient.   Current Outpatient Medications  Medication Sig Dispense Refill  . levonorgestrel (MIRENA) 20 MCG/24HR IUD 1 each by Intrauterine route once.    Marland Kitchen aspirin EC 81 MG tablet Take by mouth.    . DOCOSAHEXAENOIC ACID PO Take 1 g by mouth.    . fluticasone (FLONASE) 50 MCG/ACT nasal spray Place 2 sprays into both nostrils daily. 16 g 0  . loratadine (CLARITIN) 10 MG tablet Take by mouth.    Marland Kitchen LORazepam (ATIVAN) 0.5 MG tablet Take 1 tablet (0.5 mg total) by mouth daily as needed for anxiety. 30 tablet 0  . losartan-hydrochlorothiazide (HYZAAR) 100-25 MG tablet Take 1 tablet by mouth daily. 90 tablet 0  . Multiple Vitamin (MULTIVITAMIN) capsule Take by mouth.    . Potassium Chloride ER 20 MEQ TBCR Take 20 mEq by mouth daily. 90 tablet 1  . vitamin B-12 (CYANOCOBALAMIN) 1000 MCG tablet Take by mouth.    . vitamin C (ASCORBIC ACID) 500 MG tablet Take by mouth.     No current facility-administered medications for this visit.     Allergies: Sulfa antibiotics  Past Medical History:  Diagnosis Date  . Hypertension     Past Surgical History:  Procedure Laterality Date  . TONSILLECTOMY    . TONSILLECTOMY AND ADENOIDECTOMY     Patient states surgery 1996 or 1997    Family History  Problem Relation Age of Onset  . Arthritis Mother   . Miscarriages / Stillbirths Sister   . Arthritis Maternal Grandmother   . Diabetes Maternal Grandmother   . Diabetes Maternal Grandfather   . Hearing loss Maternal Grandfather   . Heart disease Maternal Grandfather   . High blood pressure Maternal Grandfather   . Stroke Maternal Grandfather     Social History   Tobacco Use  . Smoking status: Never Smoker  . Smokeless tobacco: Never Used  Substance Use Topics  . Alcohol use: Yes    Subjective:    I connected with Jessica Griffin on 04/27/19  at 10:00 AM EDT by a video enabled telemedicine application and verified that I am speaking with the correct person using two identifiers. Patient and I are the only 2 people on the video call.    I discussed the limitations of evaluation and management by telemedicine and the availability of in person appointments. The patient expressed understanding and agreed to proceed.  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  General: Well developed, well nourished, in no acute distress  Head: Normocephalic and atraumatic  Lungs: Respirations unlabored;  Neurologic: Alert and oriented; speech intact; face symmetrical;    Assessment:  1. Essential hypertension   2. Hypokalemia   3. Situational anxiety     Plan:  Refills updated; have asked patient to schedule in office visit in the next week to re-check labs and blood pressure; patient agrees; plan for follow-up in 1 week.   No follow-ups on file.  No orders of the defined types were placed in this encounter.   Requested Prescriptions   Signed Prescriptions Disp Refills  .  losartan-hydrochlorothiazide (HYZAAR) 100-25 MG tablet 90 tablet 0    Sig: Take 1 tablet by mouth daily.  . Potassium Chloride ER 20 MEQ TBCR 90 tablet 1    Sig: Take 20 mEq by mouth daily.  Marland Kitchen LORazepam (ATIVAN) 0.5 MG tablet 30 tablet 0    Sig: Take 1 tablet (0.5 mg total) by mouth daily as needed for anxiety.

## 2019-05-06 ENCOUNTER — Encounter: Payer: Self-pay | Admitting: Family

## 2019-05-06 ENCOUNTER — Ambulatory Visit (INDEPENDENT_AMBULATORY_CARE_PROVIDER_SITE_OTHER): Payer: 59 | Admitting: Family

## 2019-05-06 ENCOUNTER — Other Ambulatory Visit (INDEPENDENT_AMBULATORY_CARE_PROVIDER_SITE_OTHER): Payer: 59

## 2019-05-06 ENCOUNTER — Other Ambulatory Visit: Payer: Self-pay

## 2019-05-06 VITALS — BP 130/84 | HR 102 | Temp 98.4°F | Ht 64.5 in | Wt 203.1 lb

## 2019-05-06 DIAGNOSIS — I10 Essential (primary) hypertension: Secondary | ICD-10-CM | POA: Diagnosis not present

## 2019-05-06 LAB — COMPREHENSIVE METABOLIC PANEL
ALT: 17 U/L (ref 0–35)
AST: 16 U/L (ref 0–37)
Albumin: 4.1 g/dL (ref 3.5–5.2)
Alkaline Phosphatase: 63 U/L (ref 39–117)
BUN: 13 mg/dL (ref 6–23)
CO2: 26 mEq/L (ref 19–32)
Calcium: 9 mg/dL (ref 8.4–10.5)
Chloride: 101 mEq/L (ref 96–112)
Creatinine, Ser: 0.76 mg/dL (ref 0.40–1.20)
GFR: 99.57 mL/min (ref >60.00)
Glucose, Bld: 109 mg/dL — ABNORMAL HIGH (ref 70–99)
Potassium: 3.6 mEq/L (ref 3.5–5.1)
Sodium: 136 mEq/L (ref 135–145)
Total Bilirubin: 0.4 mg/dL (ref 0.2–1.2)
Total Protein: 7.1 g/dL (ref 6.0–8.3)

## 2019-05-06 LAB — CBC WITH DIFFERENTIAL/PLATELET
Basophils Absolute: 0 10*3/uL (ref 0.0–0.1)
Basophils Relative: 0.5 % (ref 0.0–3.0)
Eosinophils Absolute: 0.1 10*3/uL (ref 0.0–0.7)
Eosinophils Relative: 1.6 % (ref 0.0–5.0)
HCT: 41.4 % (ref 36.0–46.0)
Hemoglobin: 13.6 g/dL (ref 12.0–15.0)
Lymphocytes Relative: 29.8 % (ref 12.0–46.0)
Lymphs Abs: 2.5 10*3/uL (ref 0.7–4.0)
MCHC: 32.9 g/dL (ref 30.0–36.0)
MCV: 79.7 fl (ref 78.0–100.0)
Monocytes Absolute: 0.6 10*3/uL (ref 0.1–1.0)
Monocytes Relative: 7.3 % (ref 3.0–12.0)
Neutro Abs: 5.1 10*3/uL (ref 1.4–7.7)
Neutrophils Relative %: 60.8 % (ref 43.0–77.0)
Platelets: 315 10*3/uL (ref 150.0–400.0)
RBC: 5.2 Mil/uL — ABNORMAL HIGH (ref 3.87–5.11)
RDW: 15 % (ref 11.5–15.5)
WBC: 8.3 10*3/uL (ref 4.0–10.5)

## 2019-05-06 NOTE — Progress Notes (Signed)
Jessica Griffin is a 43 y.o. female with the following history as recorded in EpicCare:  Patient Active Problem List   Diagnosis Date Noted  . Anxiety and depression 09/16/2017  . Essential hypertension 03/06/2017    Current Outpatient Medications  Medication Sig Dispense Refill  . aspirin EC 81 MG tablet Take by mouth.    . DOCOSAHEXAENOIC ACID PO Take 1 g by mouth.    . fluticasone (FLONASE) 50 MCG/ACT nasal spray Place 2 sprays into both nostrils daily. 16 g 0  . levonorgestrel (MIRENA) 20 MCG/24HR IUD 1 each by Intrauterine route once.    . loratadine (CLARITIN) 10 MG tablet Take by mouth.    Marland Kitchen LORazepam (ATIVAN) 0.5 MG tablet Take 1 tablet (0.5 mg total) by mouth daily as needed for anxiety. 30 tablet 0  . losartan-hydrochlorothiazide (HYZAAR) 100-25 MG tablet Take 1 tablet by mouth daily. 90 tablet 0  . Multiple Vitamin (MULTIVITAMIN) capsule Take by mouth.    . Omega-3 1000 MG CAPS Take by mouth.    . phentermine (ADIPEX-P) 37.5 MG tablet     . Potassium Chloride ER 20 MEQ TBCR Take 20 mEq by mouth daily. 90 tablet 1  . vitamin B-12 (CYANOCOBALAMIN) 1000 MCG tablet Take by mouth.    . vitamin C (ASCORBIC ACID) 500 MG tablet Take by mouth.     No current facility-administered medications for this visit.     Allergies: Sulfa antibiotics  Past Medical History:  Diagnosis Date  . Hypertension     Past Surgical History:  Procedure Laterality Date  . TONSILLECTOMY    . TONSILLECTOMY AND ADENOIDECTOMY     Patient states surgery 1996 or 1997    Family History  Problem Relation Age of Onset  . Arthritis Mother   . Miscarriages / Stillbirths Sister   . Arthritis Maternal Grandmother   . Diabetes Maternal Grandmother   . Diabetes Maternal Grandfather   . Hearing loss Maternal Grandfather   . Heart disease Maternal Grandfather   . High blood pressure Maternal Grandfather   . Stroke Maternal Grandfather     Social History   Tobacco Use  . Smoking status: Never  Smoker  . Smokeless tobacco: Never Used  Substance Use Topics  . Alcohol use: Yes    Subjective:  Follow-up on hypertension; is working with weight loss specialist in [place]- has lost 17 pounds in the past 6 months; Denies any chest pain, shortness of breath, blurred vision or headache. 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      Objective:  Vitals:   05/06/19 0925  BP: 130/84  Pulse: (!) 102  Temp: 98.4 F (36.9 C)  TempSrc: Oral  SpO2: 97%  Weight: 203 lb 1.9 oz (92.1 kg)  Height: 5' 4.5" (1.638 m)    General: Well developed, well nourished, in no acute distress  Skin : Warm and dry.  Head: Normocephalic and atraumatic  Lungs: Respirations unlabored; clear to auscultation bilaterally without wheeze, rales, rhonchi  CVS exam: normal rate and regular rhythm.  Neurologic: Alert and oriented; speech intact; face symmetrical; moves all extremities well; CNII-XII intact without focal deficit   Assessment:  1. Essential hypertension     Plan:  Stable; check CBC, CMP today; continue working on weight loss goals; follow-up in 6 months, sooner prn.    No follow-ups on  file.  Orders Placed This Encounter  Procedures  . CBC w/Diff    Standing Status:   Future    Standing Expiration Date:   05/05/2020  . Comp Met (CMET)    Standing Status:   Future    Standing Expiration Date:   05/05/2020    Requested Prescriptions    No prescriptions requested or ordered in this encounter

## 2019-06-16 ENCOUNTER — Ambulatory Visit: Payer: 59 | Admitting: Women's Health

## 2019-06-16 ENCOUNTER — Telehealth: Payer: Self-pay

## 2019-06-16 ENCOUNTER — Other Ambulatory Visit: Payer: Self-pay

## 2019-06-16 ENCOUNTER — Encounter: Payer: Self-pay | Admitting: Women's Health

## 2019-06-16 VITALS — BP 134/80

## 2019-06-16 DIAGNOSIS — N938 Other specified abnormal uterine and vaginal bleeding: Secondary | ICD-10-CM

## 2019-06-16 DIAGNOSIS — N898 Other specified noninflammatory disorders of vagina: Secondary | ICD-10-CM | POA: Diagnosis not present

## 2019-06-16 LAB — TSH: TSH: 2.18 mIU/L

## 2019-06-16 LAB — WET PREP FOR TRICH, YEAST, CLUE

## 2019-06-16 MED ORDER — MEGESTROL ACETATE 20 MG PO TABS: ORAL_TABLET | ORAL | 0 refills | Status: DC

## 2019-06-16 NOTE — Telephone Encounter (Signed)
Pharmacy needed clarification to directoins. Was written "take twice a day until no bleeding and then daily.".  Rx resent with clarified directions.

## 2019-06-16 NOTE — Patient Instructions (Signed)
Abnormal Uterine Bleeding Abnormal uterine bleeding is unusual bleeding from the uterus. It includes:  Bleeding or spotting between periods.  Bleeding after sex.  Bleeding that is heavier than normal.  Periods that last longer than usual.  Bleeding after menopause. Abnormal uterine bleeding can affect women at various stages in life, including teenagers, women in their reproductive years, pregnant women, and women who have reached menopause. Common causes of abnormal uterine bleeding include:  Pregnancy.  Growths of tissue (polyps).  A noncancerous tumor in the uterus (fibroid).  Infection.  Cancer.  Hormonal imbalances. Any type of abnormal bleeding should be evaluated by a health care provider. Many cases are minor and simple to treat, while others are more serious. Treatment will depend on the cause of the bleeding. Follow these instructions at home:  Monitor your condition for any changes.  Do not use tampons, douche, or have sex if told by your health care provider.  Change your pads often.  Get regular exams that include pelvic exams and cervical cancer screening.  Keep all follow-up visits as told by your health care provider. This is important. Contact a health care provider if:  Your bleeding lasts for more than one week.  You feel dizzy at times.  You feel nauseous or you vomit. Get help right away if:  You pass out.  Your bleeding soaks through a pad every hour.  You have abdominal pain.  You have a fever.  You become sweaty or weak.  You pass large blood clots from your vagina. Summary  Abnormal uterine bleeding is unusual bleeding from the uterus.  Any type of abnormal bleeding should be evaluated by a health care provider. Many cases are minor and simple to treat, while others are more serious.  Treatment will depend on the cause of the bleeding. This information is not intended to replace advice given to you by your health care provider.  Make sure you discuss any questions you have with your health care provider. Document Released: 11/12/2005 Document Revised: 02/19/2018 Document Reviewed: 12/14/2016 Elsevier Patient Education  2020 [employer] Inc.  

## 2019-06-16 NOTE — Progress Notes (Signed)
43 year old engaged BF G2, P1 presents with irregular bleeding states will have spotting most days of the week not heavy flow and with occasional cycles.  States had bleeding yesterday.  08/2018 Mirena IUD placed, strings not seen ultrasound confirmed proper placement.  Denies vaginal itching, irritation or odor, urinary symptoms, or fever.  States does have right lower quadrant pain most often right after a cycle.  History of multiple fibroids 2 to 5 cm in size.  Works at [employer] with [employer].  Same partner, denies need for STD screening.  Primary care manages hypertension.  Exam: Appears well.  No CVAT.  Abdomen soft, obese, no rebound or radiation, external genitalia within normal limits, speculum exam scant clear discharge, no blood noted,  wet prep negative.  Bimanual no CMT or adnexal tenderness with exam  Irregular bleeding with Mirena IUD Fibroid uterus  Plan: TSH, Megace 20 mg twice daily for 1 week and then daily for 2 weeks.  Instructed to call if bleeding persists.  Reviewed no visible bleeding today .  Reviewed irregular spotting is common with Mirena IUDs, and fibroids may make becoming amenorrheic more difficult.

## 2019-06-16 NOTE — Addendum Note (Signed)
Addended by: Lorine Bears on: 06/16/2019 11:29 AM   Modules accepted: Orders

## 2019-06-17 ENCOUNTER — Encounter: Payer: Self-pay | Admitting: Women's Health

## 2019-08-17 ENCOUNTER — Other Ambulatory Visit: Payer: Self-pay | Admitting: Family

## 2019-08-24 NOTE — Telephone Encounter (Signed)
Just FYI the dates are week of Thanksgiving. No major case that week will be scheduled unless emergent.

## 2019-08-31 ENCOUNTER — Other Ambulatory Visit: Payer: Self-pay | Admitting: Women's Health

## 2019-08-31 MED ORDER — MEGESTROL ACETATE 40 MG PO TABS: 40.0000 mg | ORAL_TABLET | Freq: Two times a day (BID) | ORAL | 0 refills | Status: DC

## 2019-09-21 ENCOUNTER — Other Ambulatory Visit: Payer: Self-pay | Admitting: Women's Health

## 2019-09-21 ENCOUNTER — Encounter: Payer: Self-pay | Admitting: Women's Health

## 2019-09-21 DIAGNOSIS — N938 Other specified abnormal uterine and vaginal bleeding: Secondary | ICD-10-CM

## 2019-09-21 MED ORDER — MEGESTROL ACETATE 40 MG PO TABS: 40.0000 mg | ORAL_TABLET | Freq: Two times a day (BID) | ORAL | 0 refills | Status: DC

## 2019-09-21 NOTE — Telephone Encounter (Signed)
Telephone call, states has had 2 rounds of Megace which does make bleeding stop, after she stops irregular bleeding heavy at times reoccurs, bleeds through protection in an hour, history of multiple 2 to 5 cm in size intramural fibroids, 08/2018 Mirena IUD, will schedule sonohysterogram with Dr. Dellis Filbert.  Medical problems include hypertension

## 2019-10-15 ENCOUNTER — Ambulatory Visit (INDEPENDENT_AMBULATORY_CARE_PROVIDER_SITE_OTHER): Payer: 59

## 2019-10-15 ENCOUNTER — Other Ambulatory Visit: Payer: Self-pay

## 2019-10-15 ENCOUNTER — Other Ambulatory Visit: Payer: Self-pay | Admitting: Obstetrics & Gynecology

## 2019-10-15 ENCOUNTER — Ambulatory Visit (INDEPENDENT_AMBULATORY_CARE_PROVIDER_SITE_OTHER): Payer: 59 | Admitting: Obstetrics & Gynecology

## 2019-10-15 DIAGNOSIS — N938 Other specified abnormal uterine and vaginal bleeding: Secondary | ICD-10-CM | POA: Diagnosis not present

## 2019-10-15 DIAGNOSIS — Z975 Presence of (intrauterine) contraceptive device: Secondary | ICD-10-CM

## 2019-10-15 DIAGNOSIS — D219 Benign neoplasm of connective and other soft tissue, unspecified: Secondary | ICD-10-CM

## 2019-10-15 DIAGNOSIS — R102 Pelvic and perineal pain: Secondary | ICD-10-CM | POA: Diagnosis not present

## 2019-10-15 DIAGNOSIS — T8332XD Displacement of intrauterine contraceptive device, subsequent encounter: Secondary | ICD-10-CM

## 2019-10-15 DIAGNOSIS — N921 Excessive and frequent menstruation with irregular cycle: Secondary | ICD-10-CM | POA: Diagnosis not present

## 2019-10-15 NOTE — Progress Notes (Signed)
    Jessica Griffin Apr 27, 1976 [id number]        43 y.o.  G2P1A1L1 Engaged x 5 years  RP: Recurrent prolonged metrorrhagia/Fibroids on Mirena IUD for Pelvic US/Sonohysto  HPI: cHTN previously controlling her menometrorrhagia on the BCPs, but had to come off in 07/2018 because her BP was higher.  Has had menometrorrhagia on Mirena IUD since then.  Pelvic pain with cramps most of the time with frequent bleeding.   OB History  Gravida Para Term Preterm AB Living  2 1       1   SAB TAB Ectopic Multiple Live Births               # Outcome Date GA Lbr Len/2nd Weight Sex Delivery Anes PTL Lv  2 Gravida           1 Para             Past medical history,surgical history, problem list, medications, allergies, family history and social history were all reviewed and documented in the EPIC chart.   Directed ROS with pertinent positives and negatives documented in the history of present illness/assessment and plan.  Exam:  There were no vitals filed for this visit. General appearance:  Normal  Pelvic US today: T/V images.  Anteverted enlarged uterus with irregular contour due to subserosal fibroids.  Numerous fibroids, sampling of largest ones. Fibroids measured the largest of which is 4.95 x 3.35 cm.  The second largest is 3.42 x 3.10 cm.  All the other fibroids are less than 3 cm.  Thin symmetrical endometrial lining with no mass or thickening seen.  The IUD is seen in proper position within the intrauterine cavity.  The endometrial lining is measured at 3.37 mm.  Left ovary normal in size with normal follicular pattern.  Right ovary is questionably identified near the pedunculated fibroids to the right adnexa.  No other adnexal mass.  No free fluid in the posterior cul-de-sac.   Assessment/Plan:  43 y.o. G2P1   1. Fibroids Pelvic ultrasound findings reviewed with patient.  Numerous large fibroids.  The endometrial lining was symmetrical, normal and thin measured at 3.37 mm.   Therefore, decision not to proceed with a sonohysterogram.  Large uterine fibroids with menometrorrhagia not controlled on Mirena IUD.  Chronic hypertension preventing the use of birth control pills.  Worsening pelvic pain with cramps.  Decision to proceed with XI Robotic TLH/Bilateral Salpingectomy.  Surgery and risks briefly reviewed.  Patient will follow up with a preop visit.  2. Menometrorrhagia Will take Megace as needed until surgery.  Keep Mirena IUD in place.  3. Pelvic pain in female As above.    4. IUD (intrauterine device) in place Mirena IUD in good intrauterine position per pelvic ultrasound today.  Counseling on above issues and coordination of care more than 50% for 25 minutes.  Jessica Bruins MD, 10:44 AM 10/15/2019

## 2019-10-19 ENCOUNTER — Other Ambulatory Visit: Payer: Self-pay | Admitting: Family

## 2019-10-19 DIAGNOSIS — Z1231 Encounter for screening mammogram for malignant neoplasm of breast: Secondary | ICD-10-CM

## 2019-10-20 ENCOUNTER — Telehealth: Payer: Self-pay

## 2019-10-20 NOTE — Telephone Encounter (Signed)
I called and spoke with patient about scheduling surgery. I reviewed her ins benefits with her and her estimated surgery prepymt due by week before surgery.  I informed her our Dec surgery schedule is full and I am awaiting the Jan schedule. I will call her as soon as I get that.  We discussed need for Covid screen 4 days prior and quarantine from time of test until surgery.

## 2019-10-25 ENCOUNTER — Encounter: Payer: Self-pay | Admitting: Obstetrics & Gynecology

## 2019-10-25 NOTE — Patient Instructions (Signed)
1. Fibroids Pelvic ultrasound findings reviewed with patient.  Numerous large fibroids.  The endometrial lining was symmetrical, normal and thin measured at 3.37 mm.  Therefore, decision not to proceed with a sonohysterogram.  Large uterine fibroids with menometrorrhagia not controlled on Mirena IUD.  Chronic hypertension preventing the use of birth control pills.  Worsening pelvic pain with cramps.  Decision to proceed with XI Robotic TLH/Bilateral Salpingectomy.  Surgery and risks briefly reviewed.  Patient will follow up with a preop visit.  2. Menometrorrhagia Will take Megace as needed until surgery.  Keep Mirena IUD in place.  3. Pelvic pain in female As above.    4. IUD (intrauterine device) in place Mirena IUD in good intrauterine position per pelvic ultrasound today.  Shain, it was a pleasure seeing you today!

## 2019-11-12 ENCOUNTER — Telehealth: Payer: Self-pay

## 2019-11-12 NOTE — Telephone Encounter (Signed)
Need in error. 

## 2019-11-13 ENCOUNTER — Encounter: Payer: Self-pay | Admitting: Anesthesiology

## 2019-11-24 ENCOUNTER — Other Ambulatory Visit: Payer: Self-pay

## 2019-11-25 ENCOUNTER — Ambulatory Visit (INDEPENDENT_AMBULATORY_CARE_PROVIDER_SITE_OTHER): Payer: 59 | Admitting: Obstetrics & Gynecology

## 2019-11-25 ENCOUNTER — Encounter: Payer: Self-pay | Admitting: Obstetrics & Gynecology

## 2019-11-25 VITALS — BP 134/80

## 2019-11-25 DIAGNOSIS — R102 Pelvic and perineal pain: Secondary | ICD-10-CM | POA: Diagnosis not present

## 2019-11-25 DIAGNOSIS — N921 Excessive and frequent menstruation with irregular cycle: Secondary | ICD-10-CM

## 2019-11-25 DIAGNOSIS — D219 Benign neoplasm of connective and other soft tissue, unspecified: Secondary | ICD-10-CM | POA: Diagnosis not present

## 2019-11-25 NOTE — Patient Instructions (Addendum)
Go  To [facility] for your covid test on 11/28/19  Saturday at 11:10 AM   Jessica Griffin       Your procedure is scheduled on 12/02/19   Report to [place]  at 6:30  A.M.   Call this number if you have problems the morning of [phone]   OUR [address]ESS IS [address], WE ARE LOCATED IN THE [facility] WITH [facility].   Remember:  Do not eat food or drink liquids after midnight. Clear liquids until 5:00 am   Take these medicines the morning of surgery with A SIP OF WATER  None. Ativan if needed   Do not wear jewelry, make-up or nail polish.  Do not wear lotions, powders, or perfumes, or deoderant.  Do not shave 48 hours prior to surgery.   Do not bring valuables to the hospital.  [facility] is not responsible for any belongings or valuables.  Contacts, dentures or bridgework may not be worn into surgery.  For patients admitted to the hospital, discharge time will be determined by your treatment team.  Patients discharged the day of surgery will not be allowed to drive home.   Special instructions:    Please read over the following fact sheets that you were given:       [facility] - Preparing for Surgery  Before surgery, you can play an important role.   Because skin is not sterile, your skin needs to be as free of germs as possible.   You can reduce the number of germs on your skin by washing with CHG (chlorahexidine gluconate) soap before surgery.   CHG is an antiseptic cleaner which kills germs and bonds with the skin to continue killing germs even after washing. Please DO NOT use if you have an allergy to CHG or antibacterial soaps.   If your skin becomes reddened/irritated stop using the CHG and inform your nurse when you arrive at Short Stay. Do not shave (including legs and underarms) for at least 48 hours prior to the first CHG shower.   Please follow these instructions carefully :  1.  Shower with CHG  Soap the night before surgery and the  morning of Surgery.  2.  If you choose to wash your hair, wash your hair first as usual with your  normal  shampoo.  3.  After you shampoo, rinse your hair and body thoroughly to remove the  shampoo.                                        4.  Use CHG as you would any other liquid soap.  You can apply chg directly  to the skin and wash                       Gently with a scrungie or clean washcloth.  5.  Apply the CHG Soap to your body ONLY FROM THE NECK DOWN.   Do not use on face/ open                           Wound or open sores. Avoid contact with eyes, ears mouth and genitals (private parts).  Wash face,  Genitals (private parts) with your normal soap.             6.  Wash thoroughly, paying special attention to the area where your surgery  will be performed.  7.  Thoroughly rinse your body with warm water from the neck down.  8.  DO NOT shower/wash with your normal soap after using and rinsing off  the CHG Soap.             9.  Pat yourself dry with a clean towel.            10.  Wear clean pajamas.            11.  Place clean sheets on your bed the night of your first shower and do not  sleep with pets. Day of Surgery : Do not apply any lotions/deodorants the morning of surgery.  Please wear clean clothes to the hospital/surgery center.  FAILURE TO FOLLOW THESE INSTRUCTIONS MAY RESULT IN THE CANCELLATION OF YOUR SURGERY PATIENT SIGNATURE_________________________________  NURSE SIGNATURE__________________________________  ________________________________________________________________________   Jessica Griffin  An incentive spirometer is a tool that can help keep your lungs clear and active. This tool measures how well you are filling your lungs with each breath. Taking long deep breaths may help reverse or decrease the chance of developing breathing (pulmonary) problems (especially infection) following:  A long  period of time when you are unable to move or be active. BEFORE THE PROCEDURE   If the spirometer includes an indicator to show your best effort, your nurse or respiratory therapist will set it to a desired goal.  If possible, sit up straight or lean slightly forward. Try not to slouch.  Hold the incentive spirometer in an upright position. INSTRUCTIONS FOR USE  1. Sit on the edge of your bed if possible, or sit up as far as you can in bed or on a chair. 2. Hold the incentive spirometer in an upright position. 3. Breathe out normally. 4. Place the mouthpiece in your mouth and seal your lips tightly around it. 5. Breathe in slowly and as deeply as possible, raising the piston or the ball toward the top of the column. 6. Hold your breath for 3-5 seconds or for as long as possible. Allow the piston or ball to fall to the bottom of the column. 7. Remove the mouthpiece from your mouth and breathe out normally. 8. Rest for a few seconds and repeat Steps 1 through 7 at least 10 times every 1-2 hours when you are awake. Take your time and take a few normal breaths between deep breaths. 9. The spirometer may include an indicator to show your best effort. Use the indicator as a goal to work toward during each repetition. 10. After each set of 10 deep breaths, practice coughing to be sure your lungs are clear. If you have an incision (the cut made at the time of surgery), support your incision when coughing by placing a pillow or rolled up towels firmly against it. Once you are able to get out of bed, walk around indoors and cough well. You may stop using the incentive spirometer when instructed by your caregiver.  RISKS AND COMPLICATIONS  Take your time so you do not get dizzy or light-headed.  If you are in pain, you may need to take or ask for pain medication before doing incentive spirometry. It is harder to take a deep breath if you are having pain. AFTER USE  Rest and breathe slowly and  easily.  It can be helpful to keep track of a log of your progress. Your caregiver can provide you with a simple table to help with this. If you are using the spirometer at home, follow these instructions: [place] IF:   You are having difficultly using the spirometer.  You have trouble using the spirometer as often as instructed.  Your pain medication is not giving enough relief while using the spirometer.  You develop fever of 100.5 F (38.1 C) or higher. SEEK IMMEDIATE MEDICAL CARE IF:   You cough up bloody sputum that had not been present before.  You develop fever of 102 F (38.9 C) or greater.  You develop worsening pain at or near the incision site. MAKE SURE YOU:   Understand these instructions.  Will watch your condition.  Will get help right away if you are not doing well or get worse. Document Released: 03/25/2007 Document Revised: 02/04/2012 Document Reviewed: 05/26/2007 [facility] Patient Information 2014 [place], [place].   ________________________________________________________________________

## 2019-11-26 ENCOUNTER — Encounter (HOSPITAL_COMMUNITY): Payer: Self-pay

## 2019-11-26 ENCOUNTER — Other Ambulatory Visit: Payer: Self-pay

## 2019-11-26 ENCOUNTER — Encounter (HOSPITAL_COMMUNITY)
Admission: RE | Admit: 2019-11-26 | Discharge: 2019-11-26 | Disposition: A | Discharge status: Home / Self Care | Payer: 59 | Source: Ambulatory Visit | Attending: Obstetrics & Gynecology | Admitting: Obstetrics & Gynecology

## 2019-11-26 DIAGNOSIS — I10 Essential (primary) hypertension: Secondary | ICD-10-CM | POA: Diagnosis not present

## 2019-11-26 DIAGNOSIS — Z01818 Encounter for other preprocedural examination: Secondary | ICD-10-CM | POA: Insufficient documentation

## 2019-11-26 HISTORY — DX: Anxiety disorder, unspecified: F41.9

## 2019-11-26 LAB — CBC
HCT: 40.5 % (ref 36.0–46.0)
Hemoglobin: 13 g/dL (ref 12.0–15.0)
MCH: 26.2 pg (ref 26.0–34.0)
MCHC: 32.1 g/dL (ref 30.0–36.0)
MCV: 81.7 fL (ref 80.0–100.0)
Platelets: 324 10*3/uL (ref 150–400)
RBC: 4.96 MIL/uL (ref 3.87–5.11)
RDW: 14.8 % (ref 11.5–15.5)
WBC: 9.4 10*3/uL (ref 4.0–10.5)
nRBC: 0 % (ref 0.0–0.2)

## 2019-11-26 LAB — ABO/RH: ABO/RH(D): A POS

## 2019-11-26 NOTE — Progress Notes (Signed)
PCP - [facility] FNP Cardiologist - none  Chest x-ray - no EKG - 11/26/19 Stress Test - no ECHO -no  Cardiac Cath - no  Sleep Study - no CPAP -   Fasting Blood Sugar - NA Checks Blood Sugar _____ times a day  Blood Thinner Instructions:ASA Aspirin Instructions:none given. Pt takes 81 mg on her own and will stop 1/1 Last Dose:11/27/19  Anesthesia review:   Patient denies shortness of breath, fever, cough and chest pain at PAT appointment yes  Patient verbalized understanding of instructions that were given to them at the PAT appointment. Patient was also instructed that they will need to review over the PAT instructions again at home before surgery. yes

## 2019-11-28 ENCOUNTER — Other Ambulatory Visit (HOSPITAL_COMMUNITY)
Admission: RE | Admit: 2019-11-28 | Discharge: 2019-11-28 | Disposition: A | Discharge status: Home / Self Care | Payer: 59 | Source: Ambulatory Visit | Attending: Obstetrics & Gynecology | Admitting: Obstetrics & Gynecology

## 2019-11-28 DIAGNOSIS — Z20822 Contact with and (suspected) exposure to covid-19: Secondary | ICD-10-CM | POA: Diagnosis not present

## 2019-11-28 DIAGNOSIS — Z01812 Encounter for preprocedural laboratory examination: Secondary | ICD-10-CM | POA: Insufficient documentation

## 2019-11-29 NOTE — Progress Notes (Signed)
    Jessica Griffin 01-May-1976 [id number]        44 y.o.  G2P1A1L1  Engaged x 5 yrs  RP: Preop XI Robotic TLH/Bilateral Salpingectomy for symptomatic uterine fibroids refractory to Progesterone IUD/Megace with Menometrorrhagia.  HPI: cHTN previously controlling her menometrorrhagia on the BCPs, but had to come off in 07/2018 because her BP was higher.  Has had menometrorrhagia on Mirena IUD since then.  Pelvic pain with cramps most of the time with frequent bleeding.  OB History  Gravida Para Term Preterm AB Living  2 1       1   SAB TAB Ectopic Multiple Live Births               # Outcome Date GA Lbr Len/2nd Weight Sex Delivery Anes PTL Lv  2 Gravida           1 Para             Past medical history,surgical history, problem list, medications, allergies, family history and social history were all reviewed and documented in the EPIC chart.   Directed ROS with pertinent positives and negatives documented in the history of present illness/assessment and plan.  Exam:  Vitals:   11/25/19 1014  BP: 134/80   General appearance:  Normal  Pelvic US 10/15/2019:  T/V images. Anteverted enlarged uterus with irregular contour due to subserosal fibroids (Uterine size 09/2018 measured at 11.44 x 9.17 x 7.36 cm). Numerous fibroids, sampling of largest ones. Fibroids measured the largest of which is 4.95 x 3.35 cm. The second largest is 3.42 x 3.10 cm. All the other fibroids are less than 3 cm. Thin symmetrical endometrial lining with no mass or thickening seen. The IUD is seen in proper position within the intrauterine cavity. The endometrial lining is measured at 3.37 mm. Left ovary normal in size with normal follicular pattern. Right ovary is questionably identified near the pedunculated fibroids to the right adnexa. No other adnexal mass. No free fluid in the posterior cul-de-sac.   Assessment/Plan:  44 y.o. G2P1   1. Fibroids Pelvic ultrasound showing numerous large  fibroids with an endometrial lining symmetrical, normal and thin measured at 3.37 mm.  Large uterine fibroids with menometrorrhagia not controlled on Mirena IUD.  Chronic hypertension preventing the use of birth control pills.  Worsening pelvic pain with cramps.  Decision to proceed with XI Robotic TLH/Bilateral Salpingectomy.  Preop preparation, surgical procedure and risks as well as postop precautions thoroughly reviewed with patient.  Patient voiced understanding and agreement with plan.  2. Menometrorrhagia Will continue on Megace until surgery.  Mirena IUD left in place until surgery.  3. Pelvic pain in female As above.                        Patient was counseled as to the risk of surgery to include the following:  1. Infection (prohylactic antibiotics will be administered)  2. DVT/Pulmonary Embolism (prophylactic pneumo compression stockings will be used)  3.Trauma to internal organs requiring additional surgical procedure to repair any injury to internal organs requiring perhaps additional hospitalization days.  4.Hemmorhage requiring transfusion and blood products which carry risks such as anaphylactic reaction, hepatitis and AIDS  Patient had received literature information on the procedure scheduled and all her questions were answered and fully accepts all risk.   [name] MD, 9:53 PM 11/29/2019

## 2019-11-30 ENCOUNTER — Encounter: Payer: Self-pay | Admitting: Obstetrics & Gynecology

## 2019-11-30 LAB — NOVEL CORONAVIRUS, NAA (HOSP ORDER, SEND-OUT TO REF LAB; TAT 18-24 HRS): SARS-CoV-2, NAA: NOT DETECTED

## 2019-11-30 NOTE — Patient Instructions (Signed)
1. Fibroids Pelvic ultrasound showing numerous large fibroids with an endometrial lining symmetrical, normal and thin measured at 3.37 mm.  Large uterine fibroids with menometrorrhagia not controlled on Mirena IUD.  Chronic hypertension preventing the use of birth control pills.  Worsening pelvic pain with cramps.  Decision to proceed with XI Robotic TLH/Bilateral Salpingectomy.  Preop preparation, surgical procedure and risks as well as postop precautions thoroughly reviewed with patient.  Patient voiced understanding and agreement with plan.  2. Menometrorrhagia Will continue on Megace until surgery.  Mirena IUD left in place until surgery.  3. Pelvic pain in female As above.                        Patient was counseled as to the risk of surgery to include the following:  1. Infection (prohylactic antibiotics will be administered)  2. DVT/Pulmonary Embolism (prophylactic pneumo compression stockings will be used)  3.Trauma to internal organs requiring additional surgical procedure to repair any injury to internal organs requiring perhaps additional hospitalization days.  4.Hemmorhage requiring transfusion and blood products which carry risks such as anaphylactic reaction, hepatitis and AIDS  Patient had received literature information on the procedure scheduled and all her questions were answered and fully accepts all risk.  Jessica Griffin, it was a pleasure seeing you today!

## 2019-12-01 NOTE — Anesthesia Preprocedure Evaluation (Addendum)
Anesthesia Evaluation  Patient identified by MRN, date of birth, ID band  Reviewed: Allergy & Precautions, NPO status , Patient's Chart, lab work & pertinent test results  Airway Mallampati: II  TM Distance: >3 FB Neck ROM: Full    Dental no notable dental hx. (+) Teeth Intact, Dental Advisory Given   Pulmonary neg pulmonary ROS,    Pulmonary exam normal breath sounds clear to auscultation       Cardiovascular Exercise Tolerance: Good hypertension, Pt. on medications Normal cardiovascular exam Rhythm:Regular Rate:Normal     Neuro/Psych negative neurological ROS     GI/Hepatic Neg liver ROS,   Endo/Other    Renal/GU negative Renal ROS     Musculoskeletal negative musculoskeletal ROS (+)   Abdominal (+) + obese,   Peds  Hematology negative hematology ROS (+) Hgb 13.0 Plt 324 T&S blood  Available   Anesthesia Other Findings   Reproductive/Obstetrics negative OB ROS                            Anesthesia Physical Anesthesia Plan  ASA: II  Anesthesia Plan: General   Post-op Pain Management:    Induction: Intravenous  PONV Risk Score and Plan: 3 and Treatment may vary due to age or medical condition, Ondansetron and Dexamethasone  Airway Management Planned: Oral ETT  Additional Equipment: None  Intra-op Plan:   Post-operative Plan: Extubation in OR  Informed Consent: I have reviewed the patients History and Physical, chart, labs and discussed the procedure including the risks, benefits and alternatives for the proposed anesthesia with the patient or authorized representative who has indicated his/her understanding and acceptance.     Dental advisory given  Plan Discussed with:   Anesthesia Plan Comments: (GA w Lidocaine infusion + 0.24mcg/kg dexmedetomidine)       Anesthesia Quick Evaluation

## 2019-12-02 ENCOUNTER — Ambulatory Visit (HOSPITAL_BASED_OUTPATIENT_CLINIC_OR_DEPARTMENT_OTHER): Payer: 59 | Admitting: Physician Assistant

## 2019-12-02 ENCOUNTER — Ambulatory Visit (HOSPITAL_BASED_OUTPATIENT_CLINIC_OR_DEPARTMENT_OTHER)
Admission: RE | Admit: 2019-12-02 | Discharge: 2019-12-02 | Disposition: A | Discharge status: Home / Self Care | Payer: 59 | Source: Ambulatory Visit | Attending: Obstetrics & Gynecology | Admitting: Obstetrics & Gynecology

## 2019-12-02 ENCOUNTER — Encounter (HOSPITAL_BASED_OUTPATIENT_CLINIC_OR_DEPARTMENT_OTHER)
Admission: RE | Disposition: A | Discharge status: Home / Self Care | Payer: Self-pay | Source: Ambulatory Visit | Attending: Obstetrics & Gynecology

## 2019-12-02 ENCOUNTER — Ambulatory Visit (HOSPITAL_BASED_OUTPATIENT_CLINIC_OR_DEPARTMENT_OTHER): Payer: 59 | Admitting: Certified Registered"

## 2019-12-02 ENCOUNTER — Encounter (HOSPITAL_BASED_OUTPATIENT_CLINIC_OR_DEPARTMENT_OTHER): Payer: Self-pay | Admitting: Obstetrics & Gynecology

## 2019-12-02 ENCOUNTER — Other Ambulatory Visit: Payer: Self-pay

## 2019-12-02 DIAGNOSIS — I10 Essential (primary) hypertension: Secondary | ICD-10-CM | POA: Diagnosis not present

## 2019-12-02 DIAGNOSIS — Z79899 Other long term (current) drug therapy: Secondary | ICD-10-CM | POA: Diagnosis not present

## 2019-12-02 DIAGNOSIS — E669 Obesity, unspecified: Secondary | ICD-10-CM | POA: Insufficient documentation

## 2019-12-02 DIAGNOSIS — N921 Excessive and frequent menstruation with irregular cycle: Secondary | ICD-10-CM | POA: Diagnosis not present

## 2019-12-02 DIAGNOSIS — Z6838 Body mass index (BMI) 38.0-38.9, adult: Secondary | ICD-10-CM | POA: Diagnosis not present

## 2019-12-02 DIAGNOSIS — Z975 Presence of (intrauterine) contraceptive device: Secondary | ICD-10-CM | POA: Insufficient documentation

## 2019-12-02 DIAGNOSIS — Z7982 Long term (current) use of aspirin: Secondary | ICD-10-CM | POA: Diagnosis not present

## 2019-12-02 DIAGNOSIS — N803 Endometriosis of pelvic peritoneum: Secondary | ICD-10-CM | POA: Insufficient documentation

## 2019-12-02 DIAGNOSIS — N888 Other specified noninflammatory disorders of cervix uteri: Secondary | ICD-10-CM | POA: Diagnosis not present

## 2019-12-02 DIAGNOSIS — N736 Female pelvic peritoneal adhesions (postinfective): Secondary | ICD-10-CM

## 2019-12-02 DIAGNOSIS — F419 Anxiety disorder, unspecified: Secondary | ICD-10-CM | POA: Insufficient documentation

## 2019-12-02 DIAGNOSIS — N858 Other specified noninflammatory disorders of uterus: Secondary | ICD-10-CM | POA: Diagnosis not present

## 2019-12-02 DIAGNOSIS — Z793 Long term (current) use of hormonal contraceptives: Secondary | ICD-10-CM | POA: Insufficient documentation

## 2019-12-02 DIAGNOSIS — D251 Intramural leiomyoma of uterus: Secondary | ICD-10-CM | POA: Diagnosis not present

## 2019-12-02 DIAGNOSIS — Z9889 Other specified postprocedural states: Secondary | ICD-10-CM

## 2019-12-02 DIAGNOSIS — D259 Leiomyoma of uterus, unspecified: Secondary | ICD-10-CM | POA: Diagnosis not present

## 2019-12-02 DIAGNOSIS — K660 Peritoneal adhesions (postprocedural) (postinfection): Secondary | ICD-10-CM | POA: Diagnosis not present

## 2019-12-02 DIAGNOSIS — F418 Other specified anxiety disorders: Secondary | ICD-10-CM | POA: Diagnosis not present

## 2019-12-02 HISTORY — PX: ROBOTIC ASSISTED TOTAL HYSTERECTOMY WITH BILATERAL SALPINGO OOPHERECTOMY: SHX6086

## 2019-12-02 LAB — TYPE AND SCREEN
ABO/RH(D): A POS
Antibody Screen: NEGATIVE

## 2019-12-02 LAB — CBC
HCT: 40.9 % (ref 36.0–46.0)
Hemoglobin: 13 g/dL (ref 12.0–15.0)
MCH: 26.5 pg (ref 26.0–34.0)
MCHC: 31.8 g/dL (ref 30.0–36.0)
MCV: 83.3 fL (ref 80.0–100.0)
Platelets: 301 10*3/uL (ref 150–400)
RBC: 4.91 MIL/uL (ref 3.87–5.11)
RDW: 14.9 % (ref 11.5–15.5)
WBC: 19.6 10*3/uL — ABNORMAL HIGH (ref 4.0–10.5)
nRBC: 0 % (ref 0.0–0.2)

## 2019-12-02 LAB — POCT PREGNANCY, URINE: Preg Test, Ur: NEGATIVE

## 2019-12-02 SURGERY — HYSTERECTOMY, TOTAL, ROBOT-ASSISTED, LAPAROSCOPIC, WITH BILATERAL SALPINGO-OOPHORECTOMY
Anesthesia: General | Site: Abdomen | Laterality: Bilateral

## 2019-12-02 MED ORDER — BUPIVACAINE HCL (PF) 0.25 % IJ SOLN
INTRAMUSCULAR | Status: DC | PRN
  Administered 2019-12-02: 14 mL

## 2019-12-02 MED ORDER — LACTATED RINGERS IV SOLN
INTRAVENOUS | Status: DC
  Filled 2019-12-02: qty 1000

## 2019-12-02 MED ORDER — MIDAZOLAM HCL 2 MG/2ML IJ SOLN
INTRAMUSCULAR | Status: DC | PRN
  Administered 2019-12-02: 2 mg via INTRAVENOUS

## 2019-12-02 MED ORDER — OXYCODONE-ACETAMINOPHEN 7.5-325 MG PO TABS: 1.0000 | ORAL_TABLET | Freq: Four times a day (QID) | ORAL | 0 refills | Status: DC | PRN

## 2019-12-02 MED ORDER — ONDANSETRON HCL 4 MG/2ML IJ SOLN
4.0000 mg | Freq: Once | INTRAMUSCULAR | Status: DC | PRN
  Filled 2019-12-02: qty 2

## 2019-12-02 MED ORDER — ROCURONIUM BROMIDE 10 MG/ML (PF) SYRINGE
PREFILLED_SYRINGE | INTRAVENOUS | Status: AC
  Filled 2019-12-02: qty 10

## 2019-12-02 MED ORDER — OXYCODONE HCL 5 MG PO TABS
ORAL_TABLET | ORAL | Status: AC
  Filled 2019-12-02: qty 1

## 2019-12-02 MED ORDER — CEFAZOLIN SODIUM-DEXTROSE 2-4 GM/100ML-% IV SOLN
2.0000 g | INTRAVENOUS | Status: AC
  Administered 2019-12-02: 2 g via INTRAVENOUS
  Filled 2019-12-02: qty 100

## 2019-12-02 MED ORDER — DEXAMETHASONE SODIUM PHOSPHATE 10 MG/ML IJ SOLN
INTRAMUSCULAR | Status: AC
  Filled 2019-12-02: qty 1

## 2019-12-02 MED ORDER — OXYCODONE HCL 5 MG PO TABS
5.0000 mg | ORAL_TABLET | Freq: Once | ORAL | Status: AC | PRN
  Administered 2019-12-02: 5 mg via ORAL
  Filled 2019-12-02: qty 1

## 2019-12-02 MED ORDER — ROCURONIUM BROMIDE 10 MG/ML (PF) SYRINGE
PREFILLED_SYRINGE | INTRAVENOUS | Status: DC | PRN
  Administered 2019-12-02: 20 mg via INTRAVENOUS
  Administered 2019-12-02: 60 mg via INTRAVENOUS
  Administered 2019-12-02 (×2): 20 mg via INTRAVENOUS
  Administered 2019-12-02: 10 mg via INTRAVENOUS

## 2019-12-02 MED ORDER — LIDOCAINE 2% (20 MG/ML) 5 ML SYRINGE
INTRAMUSCULAR | Status: AC
  Filled 2019-12-02: qty 5

## 2019-12-02 MED ORDER — PROPOFOL 10 MG/ML IV BOLUS
INTRAVENOUS | Status: DC | PRN
  Administered 2019-12-02: 170 mg via INTRAVENOUS

## 2019-12-02 MED ORDER — SUGAMMADEX SODIUM 200 MG/2ML IV SOLN
INTRAVENOUS | Status: DC | PRN
  Administered 2019-12-02 (×2): 200 mg via INTRAVENOUS

## 2019-12-02 MED ORDER — LIDOCAINE 2% (20 MG/ML) 5 ML SYRINGE
INTRAMUSCULAR | Status: DC | PRN
  Administered 2019-12-02: 15 mg via INTRAVENOUS

## 2019-12-02 MED ORDER — OXYCODONE-ACETAMINOPHEN 5-325 MG PO TABS
1.0000 | ORAL_TABLET | ORAL | Status: DC | PRN
  Filled 2019-12-02: qty 2

## 2019-12-02 MED ORDER — FENTANYL CITRATE (PF) 100 MCG/2ML IJ SOLN
INTRAMUSCULAR | Status: AC
  Filled 2019-12-02: qty 2

## 2019-12-02 MED ORDER — CEFAZOLIN SODIUM-DEXTROSE 2-4 GM/100ML-% IV SOLN
INTRAVENOUS | Status: AC
  Filled 2019-12-02: qty 100

## 2019-12-02 MED ORDER — ONDANSETRON HCL 4 MG/2ML IJ SOLN
INTRAMUSCULAR | Status: DC | PRN
  Administered 2019-12-02: 4 mg via INTRAVENOUS

## 2019-12-02 MED ORDER — MIDAZOLAM HCL 2 MG/2ML IJ SOLN
INTRAMUSCULAR | Status: AC
  Filled 2019-12-02: qty 2

## 2019-12-02 MED ORDER — KETOROLAC TROMETHAMINE 30 MG/ML IJ SOLN
INTRAMUSCULAR | Status: AC
  Filled 2019-12-02: qty 1

## 2019-12-02 MED ORDER — KETAMINE HCL 10 MG/ML IJ SOLN
INTRAMUSCULAR | Status: DC | PRN
  Administered 2019-12-02 (×3): 10 mg via INTRAVENOUS

## 2019-12-02 MED ORDER — DEXAMETHASONE SODIUM PHOSPHATE 10 MG/ML IJ SOLN
INTRAMUSCULAR | Status: DC | PRN
  Administered 2019-12-02: 8 mg via INTRAVENOUS

## 2019-12-02 MED ORDER — ACETAMINOPHEN 325 MG PO TABS
650.0000 mg | ORAL_TABLET | ORAL | Status: DC | PRN
  Filled 2019-12-02: qty 2

## 2019-12-02 MED ORDER — OXYCODONE HCL 5 MG/5ML PO SOLN
5.0000 mg | Freq: Once | ORAL | Status: AC | PRN
  Filled 2019-12-02: qty 5

## 2019-12-02 MED ORDER — LIDOCAINE 2% (20 MG/ML) 5 ML SYRINGE
INTRAMUSCULAR | Status: DC | PRN
  Administered 2019-12-02: 1.5 mg/kg/h via INTRAVENOUS

## 2019-12-02 MED ORDER — HYDROMORPHONE HCL 1 MG/ML IJ SOLN
INTRAMUSCULAR | Status: AC
  Filled 2019-12-02: qty 1

## 2019-12-02 MED ORDER — FENTANYL CITRATE (PF) 250 MCG/5ML IJ SOLN
INTRAMUSCULAR | Status: DC | PRN
  Administered 2019-12-02: 25 ug via INTRAVENOUS
  Administered 2019-12-02: 100 ug via INTRAVENOUS
  Administered 2019-12-02: 50 ug via INTRAVENOUS
  Administered 2019-12-02: 25 ug via INTRAVENOUS

## 2019-12-02 MED ORDER — ONDANSETRON HCL 4 MG/2ML IJ SOLN
INTRAMUSCULAR | Status: AC
  Filled 2019-12-02: qty 2

## 2019-12-02 MED ORDER — ACETAMINOPHEN 10 MG/ML IV SOLN
INTRAVENOUS | Status: AC
  Filled 2019-12-02: qty 100

## 2019-12-02 MED ORDER — HYDROMORPHONE HCL 1 MG/ML IJ SOLN
0.5000 mg | INTRAMUSCULAR | Status: DC | PRN
  Filled 2019-12-02: qty 0.5

## 2019-12-02 MED ORDER — HYDROMORPHONE HCL 1 MG/ML IJ SOLN
0.2500 mg | INTRAMUSCULAR | Status: DC | PRN
  Administered 2019-12-02 (×3): 0.5 mg via INTRAVENOUS
  Filled 2019-12-02: qty 0.5

## 2019-12-02 MED ORDER — PROPOFOL 10 MG/ML IV BOLUS
INTRAVENOUS | Status: AC
  Filled 2019-12-02: qty 40

## 2019-12-02 MED ORDER — ACETAMINOPHEN 10 MG/ML IV SOLN
1000.0000 mg | Freq: Once | INTRAVENOUS | Status: DC | PRN
  Administered 2019-12-02: 12:00:00 1000 mg via INTRAVENOUS
  Filled 2019-12-02: qty 100

## 2019-12-02 MED ORDER — KETOROLAC TROMETHAMINE 30 MG/ML IJ SOLN
30.0000 mg | Freq: Once | INTRAMUSCULAR | Status: AC | PRN
  Administered 2019-12-02: 30 mg via INTRAVENOUS
  Filled 2019-12-02: qty 1

## 2019-12-02 MED ORDER — KETAMINE HCL 10 MG/ML IJ SOLN
INTRAMUSCULAR | Status: AC
  Filled 2019-12-02: qty 1

## 2019-12-02 MED ORDER — FENTANYL CITRATE (PF) 100 MCG/2ML IJ SOLN
25.0000 ug | INTRAMUSCULAR | Status: DC | PRN
  Administered 2019-12-02 (×2): 50 ug via INTRAVENOUS
  Filled 2019-12-02: qty 1

## 2019-12-02 SURGICAL SUPPLY — 62 items
ADH SKN CLS APL DERMABOND .7 (GAUZE/BANDAGES/DRESSINGS) ×1
BARRIER ADHS 3X4 INTERCEED (GAUZE/BANDAGES/DRESSINGS) IMPLANT
BRR ADH 4X3 ABS CNTRL BYND (GAUZE/BANDAGES/DRESSINGS)
CATH FOLEY 3WAY  5CC 16FR (CATHETERS) ×1
CATH FOLEY 3WAY 5CC 16FR (CATHETERS) ×1 IMPLANT
COVER BACK TABLE 60X90IN (DRAPES) ×2 IMPLANT
COVER TIP SHEARS 8 DVNC (MISCELLANEOUS) ×1 IMPLANT
COVER TIP SHEARS 8MM DA VINCI (MISCELLANEOUS) ×1
DEFOGGER SCOPE WARMER CLEARIFY (MISCELLANEOUS) ×2 IMPLANT
DERMABOND ADVANCED (GAUZE/BANDAGES/DRESSINGS) ×1
DERMABOND ADVANCED .7 DNX12 (GAUZE/BANDAGES/DRESSINGS) ×1 IMPLANT
DRAPE ARM DVNC X/XI (DISPOSABLE) ×4 IMPLANT
DRAPE COLUMN DVNC XI (DISPOSABLE) ×1 IMPLANT
DRAPE DA VINCI XI ARM (DISPOSABLE) ×4
DRAPE DA VINCI XI COLUMN (DISPOSABLE) ×1
DRAPE INCISE IOBAN 66X45 STRL (DRAPES) ×1 IMPLANT
DURAPREP 26ML APPLICATOR (WOUND CARE) ×2 IMPLANT
ELECT REM PT RETURN 9FT ADLT (ELECTROSURGICAL) ×2
ELECTRODE REM PT RTRN 9FT ADLT (ELECTROSURGICAL) ×1 IMPLANT
GAUZE PETROLATUM 1 X8 (GAUZE/BANDAGES/DRESSINGS) ×3 IMPLANT
GLOVE BIO SURGEON STRL SZ 6.5 (GLOVE) ×8 IMPLANT
GLOVE BIO SURGEON STRL SZ7 (GLOVE) ×1 IMPLANT
GLOVE BIO SURGEON STRL SZ7.5 (GLOVE) ×1 IMPLANT
GLOVE BIOGEL PI IND STRL 6.5 (GLOVE) IMPLANT
GLOVE BIOGEL PI IND STRL 7.0 (GLOVE) ×5 IMPLANT
GLOVE BIOGEL PI IND STRL 7.5 (GLOVE) IMPLANT
GLOVE BIOGEL PI INDICATOR 6.5 (GLOVE) ×3
GLOVE BIOGEL PI INDICATOR 7.0 (GLOVE) ×8
GLOVE BIOGEL PI INDICATOR 7.5 (GLOVE) ×3
IRRIG SUCT STRYKERFLOW 2 WTIP (MISCELLANEOUS) ×2
IRRIGATION SUCT STRKRFLW 2 WTP (MISCELLANEOUS) ×1 IMPLANT
IV NS IRRIG 3000ML ARTHROMATIC (IV SOLUTION) ×1 IMPLANT
LEGGING LITHOTOMY PAIR STRL (DRAPES) ×2 IMPLANT
MANIFOLD NEPTUNE II (INSTRUMENTS) ×1 IMPLANT
NS IRRIG 1000ML POUR BTL (IV SOLUTION) ×1 IMPLANT
OBTURATOR OPTICAL STANDARD 8MM (TROCAR) ×1
OBTURATOR OPTICAL STND 8 DVNC (TROCAR) ×1
OBTURATOR OPTICALSTD 8 DVNC (TROCAR) ×1 IMPLANT
OCCLUDER COLPOPNEUMO (BALLOONS) ×2 IMPLANT
PACK ROBOT WH (CUSTOM PROCEDURE TRAY) ×2 IMPLANT
PACK ROBOTIC GOWN (GOWN DISPOSABLE) ×2 IMPLANT
PACK TRENDGUARD 450 HYBRID PRO (MISCELLANEOUS) IMPLANT
PAD PREP 24X48 CUFFED NSTRL (MISCELLANEOUS) ×2 IMPLANT
POUCH ENDO CATCH II 15MM (MISCELLANEOUS) IMPLANT
PROTECTOR NERVE ULNAR (MISCELLANEOUS) ×4 IMPLANT
RTRCTR WOUND ALEXIS 18CM SML (INSTRUMENTS)
SAVER CELL AAL HAEMONETICS (INSTRUMENTS) IMPLANT
SEAL CANN UNIV 5-8 DVNC XI (MISCELLANEOUS) ×4 IMPLANT
SEAL XI 5MM-8MM UNIVERSAL (MISCELLANEOUS) ×4
SET IRRIG Y TYPE TUR BLADDER L (SET/KITS/TRAYS/PACK) IMPLANT
SET TRI-LUMEN FLTR TB AIRSEAL (TUBING) ×2 IMPLANT
SUT ETHIBOND 0 (SUTURE) IMPLANT
SUT VIC AB 4-0 PS2 27 (SUTURE) ×6 IMPLANT
SUT VICRYL 0 UR6 27IN ABS (SUTURE) ×2 IMPLANT
SUT VLOC 180 0 9IN  GS21 (SUTURE) ×1
SUT VLOC 180 0 9IN GS21 (SUTURE) ×1 IMPLANT
SUT VLOC 180 2-0 6IN GS21 (SUTURE) IMPLANT
TIP UTERINE 6.7X10CM GRN DISP (MISCELLANEOUS) ×1 IMPLANT
TOWEL OR 17X26 10 PK STRL BLUE (TOWEL DISPOSABLE) ×2 IMPLANT
TRENDGUARD 450 HYBRID PRO PACK (MISCELLANEOUS) ×2
TROCAR HASSON GELL 12X100 (TROCAR) IMPLANT
TROCAR PORT AIRSEAL 5X120 (TROCAR) ×2 IMPLANT

## 2019-12-02 NOTE — Anesthesia Postprocedure Evaluation (Signed)
Anesthesia Post Note  Patient: Jessica Griffin  Procedure(s) Performed: XI ROBOTIC ASSISTED TOTAL HYSTERECTOMY WITH BILATERAL SALPINGECTOMY (Bilateral Abdomen)     Patient location during evaluation: PACU Anesthesia Type: General Level of consciousness: awake and alert Pain management: pain level controlled Vital Signs Assessment: post-procedure vital signs reviewed and stable Respiratory status: spontaneous breathing, nonlabored ventilation, respiratory function stable and patient connected to nasal cannula oxygen Cardiovascular status: blood pressure returned to baseline and stable Postop Assessment: no apparent nausea or vomiting Anesthetic complications: no    Last Vitals:  Vitals:   12/02/19 1300 12/02/19 1315  BP: (!) 145/91 137/82  Pulse: 85 85  Resp: 16 10  Temp:    SpO2: 93% 95%    Last Pain:  Vitals:   12/02/19 1315  TempSrc:   PainSc: 3                  Barnet Glasgow

## 2019-12-02 NOTE — Discharge Instructions (Signed)

## 2019-12-02 NOTE — Op Note (Signed)
Operative Note  12/02/2019  12:06 PM  PATIENT:  Jessica Griffin  44 y.o. female  PRE-OPERATIVE DIAGNOSIS:  Fibroids, menometrorrhagia, pelvic pain  POST-OPERATIVE DIAGNOSIS:  Fibroids, menometrorrhagia, pelvic pain, Moderate to Severe Pelvic Endometriosis  PROCEDURE:  Procedure(s): XI ROBOTIC ASSISTED TOTAL HYSTERECTOMY WITH BILATERAL SALPINGECTOMY, EXTENSIVE LYSIS OF BOWEL ADHESIONS  SURGEON:  Surgeon(s): [name], MD Assistant: Olivia Mackie  ANESTHESIA:   general  FINDINGS: Uterus with multiple large Fibroids, Moderate to severe pelvic Endometriosis with obliterated posterior cul-de-sac.  Bilateral Ovarian Adhesions.  DESCRIPTION OF OPERATION: Under general anesthesia with endotracheal intubation, the patient is in lithotomy position.  She is prepped with DuraPrep on the abdomen and with Betadine on the suprapubic, vulvar and vaginal areas.  The Foley is put in place in the bladder.  The patient is draped as usual.  Timeout is done.  The weighted speculum is inserted in the vagina.  The anterior lip is grasped with a tenaculum.  The hysterometry is at 10 cm.  A #10 Rumi with a medium Koh ring are put in place easily.  The other instruments are removed.  We go to the abdomen.  The supraumbilical area is infiltrated with Marcaine one quarter plain.  A 1.5 cm incision is made with the scalpel at that level.  The aponeurosis is grasped with cokers.  The aponeurosis is open under direct vision with Mayo scissors.  The parietal peritoneum is opened bluntly with the finger.  A pursestring stitch of Vicryl 0 is done on the aponeurosis.  The [facility] port is inserted at that level under direct vision.  Up pneumoperitoneum is created with CO2.  The camera is inserted in that port.  Inspection of the abdominopelvic cavities reveals a normal liver, normal appendix, no pathology seen in the abdomen; in the pelvis, the uterus is enlarged with multiple fibroids, the posterior cutis sac is completely  obliterated with bowel adhesions and adhesions involving both adnexae secondary to endometriosis.  The anterior wall of the abdomen is clear.  We marked the skin for the 3 robotic ports and the assistant port.  Infiltration with Marcaine at the all sites.  Small incisions with the scalpel at all sites.  Insertion of the 3 robotic ports under direct vision with 2 ports on the right and 1 port on the lateral left.  The assistant port, a 5 mm port, is inserted at the left medial site.  The patient is positioned in deep Trendelenburg.  The robot is docked.  Targeting is done.  The robotic instruments are inserted under direct vision including the robotic scissors, the PK bipolar and the fenestrated clamp.  We go to the console.      Pictures are taken of the abdominal and pelvic organs.  We started on the right side, with cauterization and section of the right mesosalpinx.  We then cauterized and section the right round ligament.  Extensive and meticulous lysis of adhesions to detach the bowels from the posterior aspect of the uterus and from both adnexae.  We then proceeded on the left side to cauterize and section the left mesosalpinx.  We then cauterized and section the left round ligament.  The anterior visceral peritoneum is opened.  Adhesions are present anteriorly bringing the bladder higher than normal at the lower uterine segment.  Meticulous lysis of addition at that level to bring the bladder down.  The bladder was brought down past the [facility] ring.  We then go on the right side where a large fibroid is  present in the right broad ligament attached to the lower aspect of the right lateral uterus.  The fenestrated clamp is changed to a robotic tenaculum.  The fibroid is grasped with a the tenaculum and brought up.  We safely proceed with a myomectomy.  The fibroid is put in the right anterior cul-de-sac.  We can then safely cauterize and section the right utero-ovarian ligament.  The left utero-ovarian ligament  is also cauterized and section.  Both uterine arteries are skeletonized.  We confirmed that the bladder is well lowered past the [facility] ring.  The right uterine artery is cauterized very close to the uterus.  We then cauterized and section the left uterine artery very close to the uterus.  The backflow was cauterized on both sides.  We then recauterized the right uterine artery and sectioned it.  Hemostasis was adequate on both sides.  We inflated the vaginal occluder.  With the tip of the EndoShears scissors, the vagina was opened over the Moffat ring anteriorly, on each side and posteriorly.  The uterus was completely detached with both tubes and the cervix.  A second fibroid was removed from the uterus.  The uterus was successfully passed vaginally as well as the 2 free fibroids.  All specimens were sent together to pathology.  We repositioned the vaginal occluder in the vagina.  We switched the instruments to the cutting needle driver, the long tip and the fenestrated clamp.  We used a V-Loc 0 at 9 inches to close the vaginal vault.  We started at the right angle went all the way to the left angle using a running suture and came back past the midline.  The needle was removed from the abdominopelvic cavity.  The vaginal vault was well closed with good hemostasis.  We confirmed good hemostasis at all levels.  Urine was clear.  The robotic instruments were all removed.  The robot was undocked.  The CO2 was evacuated.  The pursestring stitch was attached at the supraumbilical incision.  All incisions were closed with separate stitches of Vicryl 4-0.  Dermabond was added on all incisions. The occluder was removed from the vagina.  The patient was brought to recovery in good and stable status.  ESTIMATED BLOOD LOSS: 100 mL   Intake/Output Summary (Last 24 hours) at 12/02/2019 1206 Last data filed at 12/02/2019 1143 Gross per 24 hour  Intake 1000 ml  Output 225 ml  Net 775 ml     BLOOD ADMINISTERED:none   LOCAL  MEDICATIONS USED:  MARCAINE     SPECIMEN:  Source of Specimen:  Uterus with cervix, bilateral tubes  DISPOSITION OF SPECIMEN:  PATHOLOGY  COUNTS:  YES  PLAN OF CARE: Transfer to PACU  Marie-Lyne LavoieMD12:06 PM

## 2019-12-02 NOTE — Progress Notes (Signed)
Spoke w/ Dr. Dellis Filbert earlier about pt's increased wbc.  Was told to instruct pt to call for CBC to be done on Monday am.  Tried to call office to set up lab appt for Monday but office answering machine recording said office was closed.  Pt and husband stated they would call in am for appt to set up lab for Monday.  D/c instructions given to pt and husband w/ voiced understanding and all questions answered.

## 2019-12-02 NOTE — H&P (Signed)
Loukya Mccarey is an 44 y.o. female. G2P1A1L1  Engaged x 5 yrs  RP: XI Robotic TLH/Bilateral Salpingectomy for symptomatic uterine fibroids refractory to Progesterone IUD/Megace with Menometrorrhagia.  HPI:No change x last visit, no vaginal bleeding currently on Megace.  cHTN previously controlling her menometrorrhagia on the BCPs, but had to come off in 07/2018 because her BP was higher. Has had menometrorrhagia on Mirena IUD since then. Pelvic pain with cramps most of the time with frequent bleeding.   Pertinent Gynecological History:  Menstrual History:  Patient's last menstrual period was 11/17/2019.    Past Medical History:  Diagnosis Date  . Anxiety   . Hypertension     Past Surgical History:  Procedure Laterality Date  . TONSILLECTOMY    . TONSILLECTOMY AND ADENOIDECTOMY     Patient states surgery 1996 or 1997    Family History  Problem Relation Age of Onset  . Arthritis Mother   . Miscarriages / Stillbirths Sister   . Arthritis Maternal Grandmother   . Diabetes Maternal Grandmother   . Diabetes Maternal Grandfather   . Hearing loss Maternal Grandfather   . Heart disease Maternal Grandfather   . High blood pressure Maternal Grandfather   . Stroke Maternal Grandfather     Social History:  reports that she has never smoked. She has never used smokeless tobacco. She reports current alcohol use. She reports that she does not use drugs.  Allergies:  Allergies  Allergen Reactions  . Sulfa Antibiotics Hives    Medications Prior to Admission  Medication Sig Dispense Refill Last Dose  . aspirin EC 81 MG tablet Take 81 mg by mouth daily.      Marland Kitchen loratadine (CLARITIN) 10 MG tablet Take 10 mg by mouth daily as needed for allergies.    Past Month at Unknown time  . LORazepam (ATIVAN) 0.5 MG tablet TAKE 1 TABLET BY MOUTH ONCE DAILY AS NEEDED FOR ANXIETY (Patient taking differently: Take 0.5 mg by mouth daily as needed for anxiety. ) 30 tablet 0 12/02/2019 at  0600  . losartan-hydrochlorothiazide (HYZAAR) 100-25 MG tablet Take 1 tablet by mouth daily. 90 tablet 0 12/01/2019 at Unknown time  . megestrol (MEGACE) 40 MG tablet Take 1 tablet (40 mg total) by mouth 2 (two) times daily. (Patient taking differently: Take 40 mg by mouth 2 (two) times daily as needed (bleeding). ) 20 tablet 0   . Multiple Vitamin (MULTIVITAMIN) capsule Take 1 capsule by mouth daily.    12/01/2019 at Unknown time  . Potassium Chloride ER 20 MEQ TBCR Take 20 mEq by mouth daily. 90 tablet 1 12/01/2019 at Unknown time  . levonorgestrel (MIRENA) 20 MCG/24HR IUD 1 each by Intrauterine route once.   in place  . Omega-3 1000 MG CAPS Take 1,000 mg by mouth daily.    More than a month at Unknown time  . vitamin B-12 (CYANOCOBALAMIN) 1000 MCG tablet Take by mouth.   More than a month at Unknown time  . vitamin C (ASCORBIC ACID) 500 MG tablet Take by mouth.   More than a month at Unknown time    REVIEW OF SYSTEMS: A ROS was performed and pertinent positives and negatives are included in the history.  GENERAL: No fevers or chills. HEENT: No change in vision, no earache, sore throat or sinus congestion. NECK: No pain or stiffness. CARDIOVASCULAR: No chest pain or pressure. No palpitations. PULMONARY: No shortness of breath, cough or wheeze. GASTROINTESTINAL: No abdominal pain, nausea, vomiting or diarrhea, melena or bright  red blood per rectum. GENITOURINARY: No urinary frequency, urgency, hesitancy or dysuria. MUSCULOSKELETAL: No joint or muscle pain, no back pain, no recent trauma. DERMATOLOGIC: No rash, no itching, no lesions. ENDOCRINE: No polyuria, polydipsia, no heat or cold intolerance. No recent change in weight. HEMATOLOGICAL: No anemia or easy bruising or bleeding. NEUROLOGIC: No headache, seizures, numbness, tingling or weakness. PSYCHIATRIC: No depression, no loss of interest in normal activity or change in sleep pattern.     Blood pressure (!) 156/101, pulse 92, temperature 98 F (36.7  C), temperature source Oral, resp. rate 18, height 5' 4.5" (1.638 m), weight 102.3 kg, last menstrual period 11/17/2019, SpO2 99 %, unknown if currently breastfeeding.  Physical Exam:  See office notes   Results for orders placed or performed during the hospital encounter of 12/02/19 (from the past 24 hour(s))  Pregnancy, urine POC     Status: None   Collection Time: 12/02/19  6:57 AM  Result Value Ref Range   Preg Test, Ur NEGATIVE NEGATIVE   Covid Negative  Pelvic US 10/15/2019:  T/V images. Anteverted enlarged uterus with irregular contour due to subserosal fibroids (Uterine size 09/2018 measured at 11.44 x 9.17 x 7.36 cm). Numerous fibroids, sampling of largest ones. Fibroids measured the largest of which is 4.95 x 3.35 cm. The second largest is 3.42 x 3.10 cm. All the other fibroids are less than 3 cm. Thin symmetrical endometrial lining with no mass or thickening seen. The IUD is seen in proper position within the intrauterine cavity. The endometrial lining is measured at 3.37 mm. Left ovary normal in size with normal follicular pattern. Right ovary is questionably identified near the pedunculated fibroids to the right adnexa. No other adnexal mass. No free fluid in the posterior cul-de-sac.   Assessment/Plan:  44 y.o. G2P1   1. Fibroids Pelvic ultrasound showing numerous large fibroids with an endometrial lining symmetrical, normal and thin measured at 3.37 mm. Large uterine fibroids with menometrorrhagia not controlled on Mirena IUD. Chronic hypertension preventing the use of birth control pills. Worsening pelvic pain with cramps. Decision to proceed with XI Robotic TLH/Bilateral Salpingectomy.  Preop preparation, surgical procedure and risks as well as postop precautions thoroughly reviewed with patient.  Patient voiced understanding and agreement with plan.  2. Menometrorrhagia Will continue on Megace until surgery.  Mirena IUD left in place until surgery.  3.  Pelvic pain in female As above.                        Patient was counseled as to the risk of surgery to include the following:  1. Infection (prohylactic antibiotics will be administered)  2. DVT/Pulmonary Embolism (prophylactic pneumo compression stockings will be used)  3.Trauma to internal organs requiring additional surgical procedure to repair any injury to internal organs requiring perhaps additional hospitalization days.  4.Hemmorhage requiring transfusion and blood products which carry risks such as anaphylactic reaction, hepatitis and AIDS  Patient had received literature information on the procedure scheduled and all her questions were answered and fully accepts all risk.    Marie-Lyne Makar Slatter 12/02/2019, 8:20 AM

## 2019-12-02 NOTE — Transfer of Care (Signed)
Immediate Anesthesia Transfer of Care Note  Patient: Jessica Griffin  Procedure(s) Performed: XI ROBOTIC ASSISTED TOTAL HYSTERECTOMY WITH BILATERAL SALPINGECTOMY (Bilateral Abdomen)  Patient Location: PACU  Anesthesia Type:General  Level of Consciousness: awake, alert  and oriented  Airway & Oxygen Therapy: Patient Spontanous Breathing and Patient connected to face mask oxygen  Post-op Assessment: Report given to RN and Post -op Vital signs reviewed and stable  Post vital signs: Reviewed and stable  Last Vitals:  Vitals Value Taken Time  BP 131/81 12/02/19 1204  Temp    Pulse 82 12/02/19 1207  Resp 21 12/02/19 1206  SpO2 96 % 12/02/19 1207  Vitals shown include unvalidated device data.  Last Pain:  Vitals:   12/02/19 0723  TempSrc: Oral  PainSc: 0-No pain         Complications: No apparent anesthesia complications

## 2019-12-02 NOTE — Anesthesia Procedure Notes (Signed)
Procedure Name: Intubation Date/Time: 12/02/2019 8:39 AM Performed by: Eben Burow, CRNA Pre-anesthesia Checklist: Patient identified, Emergency Drugs available, Suction available, Patient being monitored and Timeout performed Patient Re-evaluated:Patient Re-evaluated prior to induction Oxygen Delivery Method: Circle system utilized Preoxygenation: Pre-oxygenation with 100% oxygen Induction Type: IV induction Ventilation: Mask ventilation without difficulty Laryngoscope Size: Mac and 4 Grade View: Grade I Tube type: Oral Tube size: 7.0 mm Number of attempts: 2 Airway Equipment and Method: Stylet Placement Confirmation: ETT inserted through vocal cords under direct vision,  positive ETCO2 and breath sounds checked- equal and bilateral Secured at: 22 cm Tube secured with: Tape Dental Injury: Teeth and Oropharynx as per pre-operative assessment  Comments: DVL x 1, unable to pass ett, DVL a second time, bougie passed easily and 7.0 ett passed over bougie, +/= BBS and + ETCO2.

## 2019-12-03 ENCOUNTER — Other Ambulatory Visit: Payer: Self-pay | Admitting: *Deleted

## 2019-12-03 DIAGNOSIS — D72828 Other elevated white blood cell count: Secondary | ICD-10-CM

## 2019-12-03 LAB — SURGICAL PATHOLOGY

## 2019-12-07 ENCOUNTER — Other Ambulatory Visit: Payer: Self-pay | Admitting: Family

## 2019-12-07 ENCOUNTER — Other Ambulatory Visit: Payer: 59

## 2019-12-07 ENCOUNTER — Other Ambulatory Visit: Payer: Self-pay

## 2019-12-07 DIAGNOSIS — D72828 Other elevated white blood cell count: Secondary | ICD-10-CM

## 2019-12-07 LAB — CBC
HCT: 40.5 % (ref 35.0–45.0)
Hemoglobin: 13.1 g/dL (ref 11.7–15.5)
MCH: 26.2 pg — ABNORMAL LOW (ref 27.0–33.0)
MCHC: 32.3 g/dL (ref 32.0–36.0)
MCV: 81 fL (ref 80.0–100.0)
MPV: 10.4 fL (ref 7.5–12.5)
Platelets: 335 10*3/uL (ref 140–400)
RBC: 5 10*6/uL (ref 3.80–5.10)
RDW: 13.6 % (ref 11.0–15.0)
WBC: 9.4 10*3/uL (ref 3.8–10.8)

## 2019-12-07 NOTE — Discharge Summary (Signed)
Physician Discharge Summary  Patient ID: Jessica Griffin MRN: [id number] DOB/AGE: 1976-02-20 44 y.o.  Admit date: 12/02/2019 Discharge date: 12/07/2019  Admission Diagnoses: Postoperative state [Z98.890]   Discharge Diagnoses: Large symptomatic uterine fibroids.  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  Vitals:   12/02/19 1508 12/02/19 1659  BP: (!) 142/88 (!) 151/92  Pulse: 86 91  Resp: 14 16  Temp: 97.6 F (36.4 C) 97.8 F (36.6 C)  SpO2: 93% 97%      Hospital Course: Good  Discharge Exam: Normal postop  Disposition: Discharge disposition: 01-Home or Self Care          Allergies as of 12/02/2019      Reactions   Sulfa Antibiotics Hives      Medication List    STOP taking these medications   levonorgestrel 20 MCG/24HR IUD Commonly known as: MIRENA   megestrol 40 MG tablet Commonly known as: MEGACE     TAKE these medications   aspirin EC 81 MG tablet Take 81 mg by mouth daily.   loratadine 10 MG tablet Commonly known as: CLARITIN Take 10 mg by mouth daily as needed for allergies.   LORazepam 0.5 MG tablet Commonly known as: ATIVAN TAKE 1 TABLET BY MOUTH ONCE DAILY AS NEEDED FOR ANXIETY What changed: See the new instructions.   multivitamin capsule Take 1 capsule by mouth daily.   Omega-3 1000 MG Caps Take 1,000 mg by mouth daily.   oxyCODONE-acetaminophen 7.5-325 MG tablet Commonly known as: Percocet Take 1 tablet by mouth every 6 (six) hours as needed for severe pain.   vitamin B-12 1000 MCG tablet Commonly known as: CYANOCOBALAMIN Take by mouth.   vitamin C 500 MG tablet Commonly known as: ASCORBIC ACID Take by mouth.        Follow-up Information     [name], MD Follow up in 3 week(s).   Specialty: Obstetrics and Gynecology Contact information: [address] [place] [address] [phone]            Signed: [name] 12/07/2019, 5:36 PM

## 2019-12-08 ENCOUNTER — Ambulatory Visit: Payer: 59

## 2019-12-08 NOTE — Telephone Encounter (Signed)
Called and spoke with patient and informed her of result.

## 2019-12-24 ENCOUNTER — Other Ambulatory Visit: Payer: Self-pay

## 2019-12-25 ENCOUNTER — Ambulatory Visit (INDEPENDENT_AMBULATORY_CARE_PROVIDER_SITE_OTHER): Payer: 59 | Admitting: Obstetrics & Gynecology

## 2019-12-25 ENCOUNTER — Encounter: Payer: Self-pay | Admitting: Obstetrics & Gynecology

## 2019-12-25 VITALS — BP 136/80

## 2019-12-25 DIAGNOSIS — Z09 Encounter for follow-up examination after completed treatment for conditions other than malignant neoplasm: Secondary | ICD-10-CM

## 2019-12-25 NOTE — Progress Notes (Signed)
    Jessica Griffin [birth year] [id number]        44 y.o.  G2P1A1L1 Engaged  RP: Postop XI Robotic TLH/Bilateral Salpingectomy on December 02, 2019  HPI: Very good postop healing with no abdominal pelvic pain.  No vaginal bleeding.  Normal vaginal secretions.  Urine and bowel movements normal.  No fever.   OB History  Gravida Para Term Preterm AB Living  2 1       1   SAB TAB Ectopic Multiple Live Births               # Outcome Date GA Lbr Len/2nd Weight Sex Delivery Anes PTL Lv  2 Gravida           1 Para             Past medical history,surgical history, problem list, medications, allergies, family history and social history were all reviewed and documented in the EPIC chart.   Directed ROS with pertinent positives and negatives documented in the history of present illness/assessment and plan.  Exam:  There were no vitals filed for this visit. General appearance:  Normal  Abdomen: Normal.  Incisions well closed.  No erythema or induration.  No drainage.  Gynecologic exam: Vulva normal.  Speculum:  Vaginal vault well closed.  No bleeding.  Normal secretions.  Patho 12/02/2019: FINAL MICROSCOPIC DIAGNOSIS:   A. UTERUS, CERVIX, BILATERAL FALLOPIAN TUBES:  Uterus:  - Benign endometrium with tubal metaplasia and exogenous hormone effect  - Leiomyomata (4.2 cm; largest)  - Serosa with fibrous adhesions  - No hyperplasia or malignancy identified   Cervix:  - Benign cervix with Nabothian cysts  - No dysplasia or malignancy identified   Bilateral Fallopian tubes:  - Benign fallopian tubes  - No malignancy identified    Assessment/Plan:  44 y.o. G2P1   1. Status post gynecological surgery, follow-up exam Very good postop healing.  No complication.  Pathology benign with large fibroids.  Will apply Vaseline on the incisions to remove the glue.  No intercourse until 7 to 8 weeks postop.  Can resume all other physical activities.  F/U vaginal vault check in 4  weeks.  [name] MD, 3:31 PM 12/25/2019

## 2019-12-25 NOTE — Patient Instructions (Signed)
1. Status post gynecological surgery, follow-up exam Very good postop healing.  No complication.  Pathology benign with large fibroids.  Will apply Vaseline on the incisions to remove the glue.  No intercourse until 7 to 8 weeks postop.  Can resume all other physical activities.  F/U vaginal vault check in 4 weeks.  Jessica Griffin, it was a pleasure seeing you today!

## 2019-12-28 NOTE — Telephone Encounter (Signed)
Is this ok?

## 2020-01-06 DIAGNOSIS — H52223 Regular astigmatism, bilateral: Secondary | ICD-10-CM | POA: Diagnosis not present

## 2020-01-06 DIAGNOSIS — H524 Presbyopia: Secondary | ICD-10-CM | POA: Diagnosis not present

## 2020-01-06 DIAGNOSIS — H5213 Myopia, bilateral: Secondary | ICD-10-CM | POA: Diagnosis not present

## 2020-01-21 ENCOUNTER — Other Ambulatory Visit: Payer: Self-pay

## 2020-01-22 ENCOUNTER — Ambulatory Visit (INDEPENDENT_AMBULATORY_CARE_PROVIDER_SITE_OTHER): Payer: 59 | Admitting: Obstetrics & Gynecology

## 2020-01-22 ENCOUNTER — Encounter: Payer: Self-pay | Admitting: Obstetrics & Gynecology

## 2020-01-22 VITALS — BP 136/88

## 2020-01-22 DIAGNOSIS — Z09 Encounter for follow-up examination after completed treatment for conditions other than malignant neoplasm: Secondary | ICD-10-CM

## 2020-01-22 NOTE — Patient Instructions (Signed)
1. Status post gynecological surgery, follow-up exam Good postop evolution with no complication.  Vaginal vault well-healed.  Can resume sexual activities.  Precautions discussed.  Follow-up next year for annual gynecologic exam.  Jessica Griffin, it was a pleasure seeing you today!

## 2020-01-22 NOTE — Progress Notes (Signed)
    Jessica Griffin [birth year] [id number]        44 y.o.  G2P1A1L1 Engaged  RP: Postop visit after a Robotic TLH/Bilateral Salpingectomy 12/02/2019  HPI: Very good postop healing with no abdominal pelvic pain.  No vaginal bleeding.  Normal vaginal secretions.  Urine and bowel movements normal.  No fever.    OB History  Gravida Para Term Preterm AB Living  2 1       1   SAB TAB Ectopic Multiple Live Births               # Outcome Date GA Lbr Len/2nd Weight Sex Delivery Anes PTL Lv  2 Gravida           1 Para             Past medical history,surgical history, problem list, medications, allergies, family history and social history were all reviewed and documented in the EPIC chart.   Directed ROS with pertinent positives and negatives documented in the history of present illness/assessment and plan.  Exam:  Vitals:   01/22/20 1037  BP: 136/88   General appearance:  Normal  Abdomen: Normal.  Incisions well healed.  Gynecologic exam: Vulva normal.  Bimanual exam:  Vaginal vault well closed, no induration, NT, no blood.  No pelvic mass felt.   Assessment/Plan:  43 y.o. G2P1   1. Status post gynecological surgery, follow-up exam Good postop evolution with no complication.  Vaginal vault well-healed.  Can resume sexual activities.  Precautions discussed.  Follow-up next year for annual gynecologic exam.  [name] MD, 10:57 AM 01/22/2020

## 2020-01-26 ENCOUNTER — Encounter: Payer: Self-pay | Admitting: Anesthesiology

## 2020-01-29 ENCOUNTER — Ambulatory Visit: Payer: 59

## 2020-02-26 ENCOUNTER — Other Ambulatory Visit: Payer: Self-pay

## 2020-02-26 ENCOUNTER — Ambulatory Visit
Admission: RE | Admit: 2020-02-26 | Discharge: 2020-02-26 | Disposition: A | Discharge status: Home / Self Care | Payer: 59 | Source: Ambulatory Visit | Attending: Family | Admitting: Family

## 2020-02-26 DIAGNOSIS — Z1231 Encounter for screening mammogram for malignant neoplasm of breast: Secondary | ICD-10-CM | POA: Diagnosis not present

## 2020-05-24 ENCOUNTER — Encounter: Payer: Self-pay | Admitting: Family

## 2020-05-25 ENCOUNTER — Ambulatory Visit (INDEPENDENT_AMBULATORY_CARE_PROVIDER_SITE_OTHER): Payer: 59 | Admitting: Family

## 2020-05-25 ENCOUNTER — Encounter: Payer: Self-pay | Admitting: Family

## 2020-05-25 ENCOUNTER — Other Ambulatory Visit: Payer: Self-pay

## 2020-05-25 VITALS — BP 130/82 | HR 87 | Temp 98.7°F | Wt 227.4 lb

## 2020-05-25 DIAGNOSIS — I10 Essential (primary) hypertension: Secondary | ICD-10-CM | POA: Diagnosis not present

## 2020-05-25 DIAGNOSIS — F418 Other specified anxiety disorders: Secondary | ICD-10-CM

## 2020-05-25 DIAGNOSIS — K625 Hemorrhage of anus and rectum: Secondary | ICD-10-CM | POA: Diagnosis not present

## 2020-05-25 LAB — COMPREHENSIVE METABOLIC PANEL
ALT: 27 U/L (ref 0–35)
AST: 22 U/L (ref 0–37)
Albumin: 4.4 g/dL (ref 3.5–5.2)
Alkaline Phosphatase: 64 U/L (ref 39–117)
BUN: 12 mg/dL (ref 6–23)
CO2: 27 mEq/L (ref 19–32)
Calcium: 9.5 mg/dL (ref 8.4–10.5)
Chloride: 102 mEq/L (ref 96–112)
Creatinine, Ser: 0.72 mg/dL (ref 0.40–1.20)
GFR: 105.48 mL/min (ref >60.00)
Glucose, Bld: 106 mg/dL — ABNORMAL HIGH (ref 70–99)
Potassium: 3.5 mEq/L (ref 3.5–5.1)
Sodium: 137 mEq/L (ref 135–145)
Total Bilirubin: 0.4 mg/dL (ref 0.2–1.2)
Total Protein: 7.3 g/dL (ref 6.0–8.3)

## 2020-05-25 LAB — CBC WITH DIFFERENTIAL/PLATELET
Basophils Absolute: 0.1 10*3/uL (ref 0.0–0.1)
Basophils Relative: 1.4 % (ref 0.0–3.0)
Eosinophils Absolute: 0.2 10*3/uL (ref 0.0–0.7)
Eosinophils Relative: 2.5 % (ref 0.0–5.0)
HCT: 38.9 % (ref 36.0–46.0)
Hemoglobin: 12.7 g/dL (ref 12.0–15.0)
Lymphocytes Relative: 30.9 % (ref 12.0–46.0)
Lymphs Abs: 2.1 10*3/uL (ref 0.7–4.0)
MCHC: 32.8 g/dL (ref 30.0–36.0)
MCV: 78.5 fl (ref 78.0–100.0)
Monocytes Absolute: 0.6 10*3/uL (ref 0.1–1.0)
Monocytes Relative: 8.2 % (ref 3.0–12.0)
Neutro Abs: 4 10*3/uL (ref 1.4–7.7)
Neutrophils Relative %: 57 % (ref 43.0–77.0)
Platelets: 348 10*3/uL (ref 150.0–400.0)
RBC: 4.95 Mil/uL (ref 3.87–5.11)
RDW: 14.6 % (ref 11.5–15.5)
WBC: 7 10*3/uL (ref 4.0–10.5)

## 2020-05-25 MED ORDER — PROCTOFOAM HC 1-1 % EX FOAM: 1.0000 | Freq: Three times a day (TID) | CUTANEOUS | 0 refills | Status: DC

## 2020-05-25 MED ORDER — LORAZEPAM 0.5 MG PO TABS: ORAL_TABLET | ORAL | 1 refills | Status: DC

## 2020-05-25 NOTE — Patient Instructions (Addendum)
Please start checking your blood pressure regularly; let me hear back from you in about a week please.    Hemorrhoids Hemorrhoids are swollen veins that may develop:  In the butt (rectum). These are called internal hemorrhoids.  Around the opening of the butt (anus). These are called external hemorrhoids. Hemorrhoids can cause pain, itching, or bleeding. Most of the time, they do not cause serious problems. They usually get better with diet changes, lifestyle changes, and other home treatments. What are the causes? This condition may be caused by:  Having trouble pooping (constipation).  Pushing hard (straining) to poop.  Watery poop (diarrhea).  Pregnancy.  Being very overweight (obese).  Sitting for long periods of time.  Heavy lifting or other activity that causes you to strain.  Anal sex.  Riding a bike for a long period of time. What are the signs or symptoms? Symptoms of this condition include:  Pain.  Itching or soreness in the butt.  Bleeding from the butt.  Leaking poop.  Swelling in the area.  One or more lumps around the opening of your butt. How is this diagnosed? A doctor can often diagnose this condition by looking at the affected area. The doctor may also:  Do an exam that involves feeling the area with a gloved hand (digital rectal exam).  Examine the area inside your butt using a small tube (anoscope).  Order blood tests. This may be done if you have lost a lot of blood.  Have you get a test that involves looking inside the colon using a flexible tube with a camera on the end (sigmoidoscopy or colonoscopy). How is this treated? This condition can usually be treated at home. Your doctor may tell you to change what you eat, make lifestyle changes, or try home treatments. If these do not help, procedures can be done to remove the hemorrhoids or make them smaller. These may involve:  Placing rubber bands at the base of the hemorrhoids to cut off  their blood supply.  Injecting medicine into the hemorrhoids to shrink them.  Shining a type of light energy onto the hemorrhoids to cause them to fall off.  Doing surgery to remove the hemorrhoids or cut off their blood supply. Follow these instructions at home: Eating and drinking   Eat foods that have a lot of fiber in them. These include whole grains, beans, nuts, fruits, and vegetables.  Ask your doctor about taking products that have added fiber (fibersupplements).  Reduce the amount of fat in your diet. You can do this by: ? Eating low-fat dairy products. ? Eating less red meat. ? Avoiding processed foods.  Drink enough fluid to keep your pee (urine) pale yellow. Managing pain and swelling   Take a warm-water bath (sitz bath) for 20 minutes to ease pain. Do this 3-4 times a day. You may do this in a bathtub or using a portable sitz bath that fits over the toilet.  If told, put ice on the painful area. It may be helpful to use ice between your warm baths. ? Put ice in a plastic bag. ? Place a towel between your skin and the bag. ? Leave the ice on for 20 minutes, 2-3 times a day. General instructions  Take over-the-counter and prescription medicines only as told by your doctor. ? Medicated creams and medicines may be used as told.  Exercise often. Ask your doctor how much and what kind of exercise is best for you.  Go to the bathroom  when you have the urge to poop. Do not wait.  Avoid pushing too hard when you poop.  Keep your butt dry and clean. Use wet toilet paper or moist towelettes after pooping.  Do not sit on the toilet for a long time.  Keep all follow-up visits as told by your doctor. This is important. Contact a doctor if you:  Have pain and swelling that do not get better with treatment or medicine.  Have trouble pooping.  Cannot poop.  Have pain or swelling outside the area of the hemorrhoids. Get help right away if you have:  Bleeding that  will not stop. Summary  Hemorrhoids are swollen veins in the butt or around the opening of the butt.  They can cause pain, itching, or bleeding.  Eat foods that have a lot of fiber in them. These include whole grains, beans, nuts, fruits, and vegetables.  Take a warm-water bath (sitz bath) for 20 minutes to ease pain. Do this 3-4 times a day. This information is not intended to replace advice given to you by your health care provider. Make sure you discuss any questions you have with your health care provider. Document Revised: 11/20/2018 Document Reviewed: 04/03/2018 Elsevier Patient Education  [place].

## 2020-05-25 NOTE — Progress Notes (Signed)
Jessica Griffin is a 44 y.o. female with the following history as recorded in EpicCare:  Patient Active Problem List   Diagnosis Date Noted  . Postoperative state 12/02/2019  . Anxiety and depression 09/16/2017  . Essential hypertension 03/06/2017    Current Outpatient Medications  Medication Sig Dispense Refill  . aspirin EC 81 MG tablet Take 81 mg by mouth daily.     Marland Kitchen loratadine (CLARITIN) 10 MG tablet Take 10 mg by mouth daily as needed for allergies.     Marland Kitchen LORazepam (ATIVAN) 0.5 MG tablet TAKE 1 TABLET BY MOUTH ONCE DAILY AS NEEDED FOR ANXIETY 30 tablet 1  . losartan-hydrochlorothiazide (HYZAAR) 100-25 MG tablet TAKE 1 TABLET BY MOUTH DAILY. 90 tablet 1  . Multiple Vitamin (MULTIVITAMIN) capsule Take 1 capsule by mouth daily.     . Omega-3 1000 MG CAPS Take 1,000 mg by mouth daily.     . potassium chloride SA (KLOR-CON) 20 MEQ tablet TAKE 1 TABLET BY MOUTH ONCE DAILY 90 tablet 1  . vitamin B-12 (CYANOCOBALAMIN) 1000 MCG tablet Take by mouth.    . vitamin C (ASCORBIC ACID) 500 MG tablet Take by mouth.    . hydrocortisone-pramoxine (PROCTOFOAM HC) rectal foam Place 1 applicator rectally 3 (three) times daily. 10 g 0   No current facility-administered medications for this visit.    Allergies: Sulfa antibiotics  Past Medical History:  Diagnosis Date  . Anxiety   . Hypertension     Past Surgical History:  Procedure Laterality Date  . ROBOTIC ASSISTED TOTAL HYSTERECTOMY WITH BILATERAL SALPINGO OOPHERECTOMY Bilateral 12/02/2019   Procedure: XI ROBOTIC ASSISTED TOTAL HYSTERECTOMY WITH BILATERAL SALPINGECTOMY;  Surgeon: [name], MD;  Location: [facility];  Service: Gynecology;  Laterality: Bilateral;  . TONSILLECTOMY    . TONSILLECTOMY AND ADENOIDECTOMY     Patient states surgery 1996 or 1997    Family History  Problem Relation Age of Onset  . Arthritis Mother   . Miscarriages / Stillbirths Sister   . Arthritis Maternal Grandmother   . Diabetes  Maternal Grandmother   . Diabetes Maternal Grandfather   . Hearing loss Maternal Grandfather   . Heart disease Maternal Grandfather   . High blood pressure Maternal Grandfather   . Stroke Maternal Grandfather     Social History   Tobacco Use  . Smoking status: Never Smoker  . Smokeless tobacco: Never Used  Substance Use Topics  . Alcohol use: Yes    Subjective:  Patient noticed blood in her stool on 2 episodes yesterday- bright red blood; does seem better today; no prior history of hemorrhoids; has not had baseline colonoscopy; admits that stress level has been very high recently- husband has had a seizure and health complications- thankfully he is improving;  Would like refill on Ativan; Has not been checking blood pressure regularly; feels very good since having hysterectomy earlier this year;    Objective:  Vitals:   05/25/20 0846  BP: 130/82  Pulse: 87  Temp: 98.7 F (37.1 C)  TempSrc: Oral  SpO2: 95%  Weight: 227 lb 6.4 oz (103.1 kg)    General: Well developed, well nourished, in no acute distress  Skin : Warm and dry.  Head: Normocephalic and atraumatic  Lungs: Respirations unlabored; clear to auscultation bilaterally without wheeze, rales, rhonchi  CVS exam: normal rate and regular rhythm.  Neurologic: Alert and oriented; speech intact; face symmetrical; moves all extremities well; CNII-XII intact without focal deficit  Rectal exam- small external hemorrhoid noted  Assessment:  1. Rectal bleeding   2. Essential hypertension   3. Situational anxiety     Plan:  1. Suspect hemorrhoids; check labs today; Rx for Proctofoam apply tid; refer to GI for baseline colonoscopy; 2. Stable; she will check her blood pressure at work over the next week and follow-up if elevated; it was 140/102 on initial check today; 3. Refill on Ativan prn;  This visit occurred during the SARS-CoV-2 public health emergency.  Safety protocols were in place, including screening questions  prior to the visit, additional usage of staff PPE, and extensive cleaning of exam room while observing appropriate contact time as indicated for disinfecting solutions.     No follow-ups on file.  Orders Placed This Encounter  Procedures  . CBC with Differential/Platelet  . Iron, TIBC and Ferritin Panel  . Comp Met (CMET)  . Ambulatory referral to Gastroenterology    Referral Priority:   Routine    Referral Type:   Consultation    Referral Reason:   Specialty Services Required    Number of Visits Requested:   1    Requested Prescriptions   Signed Prescriptions Disp Refills  . LORazepam (ATIVAN) 0.5 MG tablet 30 tablet 1    Sig: TAKE 1 TABLET BY MOUTH ONCE DAILY AS NEEDED FOR ANXIETY  . hydrocortisone-pramoxine (PROCTOFOAM HC) rectal foam 10 g 0    Sig: Place 1 applicator rectally 3 (three) times daily.

## 2020-05-26 LAB — IRON,TIBC AND FERRITIN PANEL
%SAT: 18 % (calc) (ref 16–45)
Ferritin: 55 ng/mL (ref 16–232)
Iron: 61 ug/dL (ref 40–190)
TIBC: 333 mcg/dL (calc) (ref 250–450)

## 2020-06-03 ENCOUNTER — Encounter: Payer: Self-pay | Admitting: Gastroenterology

## 2020-06-28 ENCOUNTER — Encounter: Payer: Self-pay | Admitting: Family

## 2020-07-18 ENCOUNTER — Other Ambulatory Visit: Payer: Self-pay | Admitting: Family

## 2020-08-02 ENCOUNTER — Ambulatory Visit (INDEPENDENT_AMBULATORY_CARE_PROVIDER_SITE_OTHER): Payer: 59 | Admitting: Gastroenterology

## 2020-08-02 ENCOUNTER — Encounter: Payer: Self-pay | Admitting: Gastroenterology

## 2020-08-02 VITALS — BP 120/78 | HR 88 | Ht 64.5 in | Wt 230.5 lb

## 2020-08-02 DIAGNOSIS — Z1211 Encounter for screening for malignant neoplasm of colon: Secondary | ICD-10-CM | POA: Diagnosis not present

## 2020-08-02 DIAGNOSIS — K625 Hemorrhage of anus and rectum: Secondary | ICD-10-CM | POA: Diagnosis not present

## 2020-08-02 MED ORDER — PLENVU 140 G PO SOLR: 140.0000 g | ORAL | 0 refills | Status: DC

## 2020-08-02 MED FILL — PLENVU 140 GM SOLR: 140 | 1 days supply | Qty: 3 | Fill #0

## 2020-08-02 NOTE — Patient Instructions (Signed)
If you are age 46 or older, your body mass index should be between 23-30. Your Body mass index is 38.95 kg/m. If this is out of the aforementioned range listed, please consider follow up with your Primary Care Provider.  If you are age 7 or younger, your body mass index should be between 19-25. Your Body mass index is 38.95 kg/m. If this is out of the aformentioned range listed, please consider follow up with your Primary Care Provider.   You have been scheduled for a colonoscopy. Please follow written instructions given to you at your visit today.  Please pick up your prep supplies at the pharmacy within the next 1-3 days. If you use inhalers (even only as needed), please bring them with you on the day of your procedure.  It was a pleasure to see you today!  Dr. Loletha Carrow

## 2020-08-02 NOTE — Progress Notes (Signed)
[facility] Consult Note:  History: Jessica Griffin 08/02/2020  Referring provider: Marrian Salvage, [place]  Reason for consult/chief complaint: Rectal Bleeding (BRB on the toilet paper and in the toilet, to discuss having a colonoscopy) and Hemorrhoids   Subjective  HPI:  This is a very pleasant 44 year old woman referred by primary care for rectal bleeding.  It occurred about 2 months ago and lasted 2 or 3 days.  She recalls there may have been some anal pain with bowel movements at that time.  There was blood in the bowl in the paper, but it all resolved quickly.  She denies chronic abdominal pain and might occasionally miss a day with bowel movements but does not feel she has chronic constipation.  She denies dysphagia odynophagia nausea or vomiting.   ROS:  Review of Systems She denies chest pain dyspnea or dysuria  Past Medical History: Past Medical History:  Diagnosis Date  . Anxiety   . Astigmatism   . DUB (dysfunctional uterine bleeding)   . Enlarged heart   . Hypertension   . Myopia   . Presbyopia   . Uterine fibroid      Past Surgical History: Past Surgical History:  Procedure Laterality Date  . ROBOTIC ASSISTED TOTAL HYSTERECTOMY WITH BILATERAL SALPINGO OOPHERECTOMY Bilateral 12/02/2019   Procedure: XI ROBOTIC ASSISTED TOTAL HYSTERECTOMY WITH BILATERAL SALPINGECTOMY;  Surgeon: [name], MD;  Location: [place];  Service: Gynecology;  Laterality: Bilateral;  . TONSILLECTOMY  1996     Family History: Family History  Problem Relation Age of Onset  . Arthritis Mother   . Miscarriages / Stillbirths Sister   . Arthritis Maternal Grandmother   . Diabetes Maternal Grandmother   . Diabetes Maternal Grandfather   . Hearing loss Maternal Grandfather   . Heart disease Maternal Grandfather   . High blood pressure Maternal Grandfather   . Stroke Maternal Grandfather     Social History: Social History    Socioeconomic History  . Marital status: Married    Spouse name: Not on file  . Number of children: 1  . Years of education: Not on file  . Highest education level: Not on file  Occupational History  . Occupation: CNA  Tobacco Use  . Smoking status: Never Smoker  . Smokeless tobacco: Never Used  Vaping Use  . Vaping Use: Never used  Substance and Sexual Activity  . Alcohol use: Yes    Comment: occasional  . Drug use: Never  . Sexual activity: Not Currently    Partners: Male    Birth control/protection: None    Comment: 1st intercourse-14, partners- 6, fiancee- 5 yrs   Other Topics Concern  . Not on file  Social History Narrative  . Not on file   Social Determinants of Health   Financial Resource Strain:   . Difficulty of Paying Living Expenses: Not on file  Food Insecurity:   . Worried About Charity fundraiser in the Last Year: Not on file  . Ran Out of Food in the Last Year: Not on file  Transportation Needs:   . Lack of Transportation (Medical): Not on file  . Lack of Transportation (Non-Medical): Not on file  Physical Activity:   . Days of Exercise per Week: Not on file  . Minutes of Exercise per Session: Not on file  Stress:   . Feeling of Stress : Not on file  Social Connections:   . Frequency of Communication with Friends and Family: Not on  file  . Frequency of Social Gatherings with Friends and Family: Not on file  . Attends Religious Services: Not on file  . Active Member of Clubs or Organizations: Not on file  . Attends Archivist Meetings: Not on file  . Marital Status: Not on file    Allergies: Allergies  Allergen Reactions  . Sulfa Antibiotics Hives    Outpatient Meds: Current Outpatient Medications  Medication Sig Dispense Refill  . aspirin EC 81 MG tablet Take 81 mg by mouth daily.     . hydrocortisone-pramoxine (PROCTOFOAM HC) rectal foam Place 1 applicator rectally 3 (three) times daily. 10 g 0  . loratadine (CLARITIN) 10 MG  tablet Take 10 mg by mouth daily as needed for allergies.     Marland Kitchen LORazepam (ATIVAN) 0.5 MG tablet TAKE 1 TABLET BY MOUTH ONCE DAILY AS NEEDED FOR ANXIETY 30 tablet 1  . losartan-hydrochlorothiazide (HYZAAR) 100-25 MG tablet TAKE 1 TABLET BY MOUTH DAILY. 90 tablet 1  . Multiple Vitamin (MULTIVITAMIN) capsule Take 1 capsule by mouth daily.     . Omega-3 1000 MG CAPS Take 1,000 mg by mouth daily.     . potassium chloride SA (KLOR-CON) 20 MEQ tablet TAKE 1 TABLET BY MOUTH ONCE DAILY 90 tablet 1  . vitamin B-12 (CYANOCOBALAMIN) 1000 MCG tablet Take by mouth.    . vitamin C (ASCORBIC ACID) 500 MG tablet Take by mouth.     No current facility-administered medications for this visit.      ___________________________________________________________________ Objective   Exam:  BP 120/78 (BP Location: Left Arm, Patient Position: Sitting, Cuff Size: Normal)   Pulse 88   Ht 5' 4.5" (1.638 m) Comment: height measured without shoes  Wt 230 lb 8 oz (104.6 kg)   LMP 11/17/2019   Breastfeeding No   BMI 38.95 kg/m    General: Well-appearing  Eyes: sclera anicteric, no redness  ENT: oral mucosa moist without lesions, no cervical or supraclavicular lymphadenopathy  CV: RRR without murmur, S1/S2, no JVD, no peripheral edema  Resp: clear to auscultation bilaterally, normal RR and effort noted  GI: soft, no tenderness, with active bowel sounds. No guarding or palpable organomegaly noted.  Skin; warm and dry, no rash or jaundice noted  Neuro: awake, alert and oriented x 3. Normal gross motor function and fluent speech Rectal: Normal external.  Normal sphincter tone, no tenderness fissure or palpable internal lesion. Labs:  CBC Latest Ref Rng & Units 05/25/2020 12/07/2019 12/02/2019  WBC 4.0 - 10.5 K/uL 7.0 9.4 19.6(H)  Hemoglobin 12.0 - 15.0 g/dL 12.7 13.1 13.0  Hematocrit 36 - 46 % 38.9 40.5 40.9  Platelets 150 - 400 K/uL 348.0 335 301    Assessment: Encounter Diagnoses  Name Primary?  .  Rectal bleeding Yes  . Special screening for malignant neoplasms, colon     Primary care suspected self-limited internal hemorrhoidal bleeding, which may have been the case but I also wonder if she may have had a small fissure that has since healed. She feels fine now, due for screening colonoscopy. Plan:  Screening colonoscopy.  She is agreeable after discussion of procedure and risks.  The benefits and risks of the planned procedure were described in detail with the patient or (when appropriate) their health care proxy.  Risks were outlined as including, but not limited to, bleeding, infection, perforation, adverse medication reaction leading to cardiac or pulmonary decompensation, pancreatitis (if ERCP).  The limitation of incomplete mucosal visualization was also discussed.  No guarantees or warranties  were given.   Thank you for the courtesy of this consult.  Please call me with any questions or concerns.  Nelida Meuse III  CC: Referring provider noted above

## 2020-08-09 ENCOUNTER — Encounter: Payer: Self-pay | Admitting: Gastroenterology

## 2020-08-09 ENCOUNTER — Ambulatory Visit (AMBULATORY_SURGERY_CENTER): Payer: 59 | Admitting: Gastroenterology

## 2020-08-09 ENCOUNTER — Other Ambulatory Visit: Payer: Self-pay

## 2020-08-09 VITALS — BP 137/95 | HR 84 | Temp 97.5°F | Resp 13 | Ht 64.0 in | Wt 230.0 lb

## 2020-08-09 DIAGNOSIS — Z1211 Encounter for screening for malignant neoplasm of colon: Secondary | ICD-10-CM | POA: Diagnosis not present

## 2020-08-09 DIAGNOSIS — D123 Benign neoplasm of transverse colon: Secondary | ICD-10-CM

## 2020-08-09 HISTORY — PX: COLONOSCOPY: SHX174

## 2020-08-09 MED ORDER — SODIUM CHLORIDE 0.9 % IV SOLN: 500.0000 mL | Freq: Once | INTRAVENOUS | Status: DC

## 2020-08-09 NOTE — Op Note (Addendum)
[place] Patient Name: Jessica Griffin Procedure Date: 08/09/2020 11:20 AM MRN: [id number] Endoscopist: Mallie Mussel L. Loletha Carrow , MD Age: 44 Referring MD:  Date of Birth: 1976-01-04 Gender: Female Account #: [id number] Procedure:                Colonoscopy Indications:              Screening for colorectal malignant neoplasm, This                            is the patient's first colonoscopy Medicines:                Monitored Anesthesia Care Procedure:                Pre-Anesthesia Assessment:                           - Prior to the procedure, a History and Physical                            was performed, and patient medications and                            allergies were reviewed. The patient's tolerance of                            previous anesthesia was also reviewed. The risks                            and benefits of the procedure and the sedation                            options and risks were discussed with the patient.                            All questions were answered, and informed consent                            was obtained. Prior Anticoagulants: The patient has                            taken no previous anticoagulant or antiplatelet                            agents. ASA Grade Assessment: II - A patient with                            mild systemic disease. After reviewing the risks                            and benefits, the patient was deemed in                            satisfactory condition to undergo the procedure.  After obtaining informed consent, the colonoscope                            was passed under direct vision. Throughout the                            procedure, the patient's blood pressure, pulse, and                            oxygen saturations were monitored continuously. The                            Colonoscope was introduced through the anus and                            advanced to the the  terminal ileum, with                            identification of the appendiceal orifice and IC                            valve. The colonoscopy was performed without                            difficulty. The patient tolerated the procedure                            well. The quality of the bowel preparation was                            excellent. The terminal ileum, ileocecal valve,                            appendiceal orifice, and rectum were photographed.                            The bowel preparation used was Plenvu. Scope In: 11:35:43 AM Scope Out: 11:49:48 AM Scope Withdrawal Time: 0 hours 12 minutes 23 seconds  Total Procedure Duration: 0 hours 14 minutes 5 seconds  Findings:                 The perianal and digital rectal examinations were                            normal.                           The terminal ileum appeared normal.                           A diminutive polyp was found in the hepatic                            flexure. The polyp was flat. The polyp was removed  with a cold biopsy forceps. Resection and retrieval                            were complete.                           The exam was otherwise without abnormality on                            direct and retroflexion views. Complications:            No immediate complications. Estimated Blood Loss:     Estimated blood loss was minimal. Impression:               - The examined portion of the ileum was normal.                           - One diminutive polyp at the hepatic flexure,                            removed with a cold biopsy forceps. Resected and                            retrieved.                           - The examination was otherwise normal on direct                            and retroflexion views.                           Recent self-limited ano-rectal bleeding was likely                            a small fissure that has since  healed. Recommendation:           - Patient has a contact number available for                            emergencies. The signs and symptoms of potential                            delayed complications were discussed with the                            patient. Return to normal activities tomorrow.                            Written discharge instructions were provided to the                            patient.                           - Resume previous diet.                           -  Continue present medications.                           - Await pathology results.                           - Repeat colonoscopy is recommended for                            surveillance. The colonoscopy date will be                            determined after pathology results from today's                            exam become available for review. Lounette Sloan L. Loletha Carrow, MD 08/09/2020 11:54:48 AM This report has been signed electronically.

## 2020-08-09 NOTE — Patient Instructions (Signed)
Read all of the handouts given to you by your recovery room nurse.   Thank-you for choosing us for your healthcare needs today.  YOU HAD AN ENDOSCOPIC PROCEDURE TODAY AT THE  ENDOSCOPY CENTER:   Refer to the procedure report that was given to you for any specific questions about what was found during the examination.  If the procedure report does not answer your questions, please call your gastroenterologist to clarify.  If you requested that your care partner not be given the details of your procedure findings, then the procedure report has been included in a sealed envelope for you to review at your convenience later.  YOU SHOULD EXPECT: Some feelings of bloating in the abdomen. Passage of more gas than usual.  Walking can help get rid of the air that was put into your GI tract during the procedure and reduce the bloating. If you had a lower endoscopy (such as a colonoscopy or flexible sigmoidoscopy) you may notice spotting of blood in your stool or on the toilet paper. If you underwent a bowel prep for your procedure, you may not have a normal bowel movement for a few days.  Please Note:  You might notice some irritation and congestion in your nose or some drainage.  This is from the oxygen used during your procedure.  There is no need for concern and it should clear up in a day or so.  SYMPTOMS TO REPORT IMMEDIATELY:   Following lower endoscopy (colonoscopy or flexible sigmoidoscopy):  Excessive amounts of blood in the stool  Significant tenderness or worsening of abdominal pains  Swelling of the abdomen that is new, acute  Fever of 100F or higher   For urgent or emergent issues, a gastroenterologist can be reached at any hour by calling [phone]. Do not use MyChart messaging for urgent concerns.    DIET:  We do recommend a small meal at first, but then you may proceed to your regular diet.  Drink plenty of fluids but you should avoid alcoholic beverages for 24  hours.  ACTIVITY:  You should plan to take it easy for the rest of today and you should NOT DRIVE or use heavy machinery until tomorrow (because of the sedation medicines used during the test).    FOLLOW UP: Our staff will call the number listed on your records 48-72 hours following your procedure to check on you and address any questions or concerns that you may have regarding the information given to you following your procedure. If we do not reach you, we will leave a message.  We will attempt to reach you two times.  During this call, we will ask if you have developed any symptoms of COVID 19. If you develop any symptoms (ie: fever, flu-like symptoms, shortness of breath, cough etc.) before then, please call [phone].  If you test positive for Covid 19 in the 2 weeks post procedure, please call and report this information to us.    If any biopsies were taken you will be contacted by phone or by letter within the next 1-3 weeks.  Please call us at [phone] if you have not heard about the biopsies in 3 weeks.    SIGNATURES/CONFIDENTIALITY: You and/or your care partner have signed paperwork which will be entered into your electronic medical record.  These signatures attest to the fact that that the information above on your After Visit Summary has been reviewed and is understood.  Full responsibility of the confidentiality of this discharge   with you and/or your care-partner.

## 2020-08-09 NOTE — Progress Notes (Signed)
Called to room to assist during endoscopic procedure.  Patient ID and intended procedure confirmed with present staff. Received instructions for my participation in the procedure from the performing physician.  

## 2020-08-09 NOTE — Progress Notes (Signed)
pt tolerated well. VSS. awake and to recovery. Report given to RN.  

## 2020-08-11 ENCOUNTER — Telehealth: Payer: Self-pay | Admitting: *Deleted

## 2020-08-11 ENCOUNTER — Telehealth: Payer: Self-pay

## 2020-08-11 NOTE — Telephone Encounter (Signed)
Left message on f/u call 

## 2020-08-11 NOTE — Telephone Encounter (Signed)
No answer, unable to leave a message, B.Akosua Constantine RN. 

## 2020-08-15 ENCOUNTER — Encounter: Payer: Self-pay | Admitting: Gastroenterology

## 2020-10-18 ENCOUNTER — Other Ambulatory Visit: Payer: Self-pay | Admitting: Family

## 2020-10-18 ENCOUNTER — Encounter: Payer: Self-pay | Admitting: Family

## 2020-10-18 MED ORDER — NEOMYCIN-POLYMYXIN-HC 1 % OT SOLN: 3.0000 [drp] | Freq: Four times a day (QID) | OTIC | 0 refills | Status: DC

## 2020-10-18 MED FILL — NEO/POLYMYXIN/HC EAR SOLN: 3.5-10000-1 | 17 days supply | Qty: 10 | Fill #0

## 2020-10-19 ENCOUNTER — Other Ambulatory Visit: Payer: Self-pay

## 2020-10-19 ENCOUNTER — Encounter: Payer: Self-pay | Admitting: Family

## 2020-10-19 ENCOUNTER — Other Ambulatory Visit: Payer: Self-pay | Admitting: Family

## 2020-10-19 ENCOUNTER — Ambulatory Visit: Payer: 59 | Admitting: Family

## 2020-10-19 VITALS — BP 128/70 | HR 101 | Temp 98.3°F | Ht 64.0 in | Wt 227.0 lb

## 2020-10-19 DIAGNOSIS — H6690 Otitis media, unspecified, unspecified ear: Secondary | ICD-10-CM | POA: Diagnosis not present

## 2020-10-19 MED ORDER — AMOXICILLIN-POT CLAVULANATE 875-125 MG PO TABS: 1.0000 | ORAL_TABLET | Freq: Two times a day (BID) | ORAL | 0 refills | Status: DC

## 2020-10-19 MED ORDER — PREDNISONE 20 MG PO TABS: 20.0000 mg | ORAL_TABLET | Freq: Every day | ORAL | 0 refills | Status: DC

## 2020-10-19 MED FILL — AMOX-CLAV 875-125 MG TABLET: 875-125 | 10 days supply | Qty: 20 | Fill #0

## 2020-10-19 MED FILL — predniSONE 20 MG TABS: 20 | 5 days supply | Qty: 5 | Fill #0

## 2020-10-19 NOTE — Progress Notes (Signed)
Jessica Griffin is a 44 y.o. female with the following history as recorded in Epic[facility]:  Patient Active Problem List   Diagnosis Date Noted   Postoperative state 12/02/2019   Anxiety and depression 09/16/2017   Essential hypertension 03/06/2017    Current Outpatient Medications  Medication Sig Dispense Refill   aspirin EC 81 MG tablet Take 81 mg by mouth daily.      hydrocortisone-pramoxine (PROCTOFOAM HC) rectal foam Place 1 applicator rectally 3 (three) times daily. 10 g 0   loratadine (CLARITIN) 10 MG tablet Take 10 mg by mouth daily as needed for allergies.      LORazepam (ATIVAN) 0.5 MG tablet TAKE 1 TABLET BY MOUTH ONCE DAILY AS NEEDED FOR ANXIETY 30 tablet 1   losartan-hydrochlorothiazide (HYZAAR) 100-25 MG tablet TAKE 1 TABLET BY MOUTH DAILY. 90 tablet 1   Multiple Vitamin (MULTIVITAMIN) capsule Take 1 capsule by mouth daily.      NEOMYCIN-POLYMYXIN-HYDROCORTISONE (CORTISPORIN) 1 % SOLN OTIC solution Place 3 drops into the left ear every 6 (six) hours. 10 mL 0   Omega-3 1000 MG CAPS Take 1,000 mg by mouth daily.      potassium chloride SA (KLOR-CON) 20 MEQ tablet TAKE 1 TABLET BY MOUTH ONCE DAILY 90 tablet 1   vitamin B-12 (CYANOCOBALAMIN) 1000 MCG tablet Take by mouth.     vitamin C (ASCORBIC ACID) 500 MG tablet Take by mouth.     amoxicillin-clavulanate (AUGMENTIN) 875-125 MG tablet Take 1 tablet by mouth 2 (two) times daily for 10 days. 20 tablet 0   predniSONE (DELTASONE) 20 MG tablet Take 1 tablet (20 mg total) by mouth daily with breakfast. 5 tablet 0   No current facility-administered medications for this visit.    Allergies: Sulfa antibiotics  Past Medical History:  Diagnosis Date   Anxiety    Astigmatism    DUB (dysfunctional uterine bleeding)    Enlarged heart    Hypertension    Myopia    Presbyopia    Uterine fibroid     Past Surgical History:  Procedure Laterality Date   COLONOSCOPY  08/09/2020   ROBOTIC ASSISTED TOTAL  HYSTERECTOMY WITH BILATERAL SALPINGO OOPHERECTOMY Bilateral 12/02/2019   Procedure: XI ROBOTIC ASSISTED TOTAL HYSTERECTOMY WITH BILATERAL SALPINGECTOMY;  Surgeon: [name], MD;  Location: [place];  Service: Gynecology;  Laterality: Bilateral;   TONSILLECTOMY  1996    Family History  Problem Relation Age of Onset   Arthritis Mother    85 / [place] Sister    Arthritis Maternal Grandmother    Diabetes Maternal Grandmother    Diabetes Maternal Grandfather    Hearing loss Maternal Grandfather    Heart disease Maternal Grandfather    High blood pressure Maternal Grandfather    Stroke Maternal Grandfather    Colon cancer Neg Hx    Esophageal cancer Neg Hx    Stomach cancer Neg Hx    Rectal cancer Neg Hx     Social History   Tobacco Use   Smoking status: Never Smoker   Smokeless tobacco: Never Used  Substance Use Topics   Alcohol use: Yes    Comment: occasional    Subjective:  Right ear pain x 2-3 days; had sinus congestion last week; still having some sinus symptoms which are improving; no fever, sore throat; limited relief with OTC medications, topical ear drops;   Objective:  Vitals:   10/19/20 1057  BP: 128/70  Pulse: (!) 101  Temp: 98.3 F (36.8 C)  TempSrc: Oral  SpO2:  98%  Weight: 227 lb (103 kg)  Height: 5\' 4"  (1.626 m)    General: Well developed, well nourished, in no acute distress  Skin : Warm and dry.  Head: Normocephalic and atraumatic  Ears: External normal; canals clear; right tympanic membrane erythematous Oropharynx: Pink, supple. No suspicious lesions  Neck: Supple without thyromegaly, adenopathy  Lungs: Respirations unlabored; clear to auscultation bilaterally without wheeze, rales, rhonchi  Neurologic: Alert and oriented; speech intact; face symmetrical; moves all extremities well; CNII-XII intact without focal deficit   Assessment:  1. Acute otitis media, unspecified otitis media type      Plan:  Rx for Augmentin 875 mg bid x 10 days, Prednisone 20 mg qd x 5 days; increase fluids, rest and follow worse, no better.   Plan for CPE in the next 6 months;   This visit occurred during the SARS-CoV-2 public health emergency.  Safety protocols were in place, including screening questions prior to the visit, additional usage of staff PPE, and extensive cleaning of exam room while observing appropriate contact time as indicated for disinfecting solutions.      No follow-ups on file.  No orders of the defined types were placed in this encounter.   Requested Prescriptions   Signed Prescriptions Disp Refills   amoxicillin-clavulanate (AUGMENTIN) 875-125 MG tablet 20 tablet 0    Sig: Take 1 tablet by mouth 2 (two) times daily for 10 days.   predniSONE (DELTASONE) 20 MG tablet 5 tablet 0    Sig: Take 1 tablet (20 mg total) by mouth daily with breakfast.

## 2020-10-28 ENCOUNTER — Encounter: Payer: Self-pay | Admitting: Family

## 2020-10-28 ENCOUNTER — Other Ambulatory Visit: Payer: Self-pay | Admitting: Family

## 2020-10-28 MED ORDER — AMOXICILLIN-POT CLAVULANATE 875-125 MG PO TABS: 1.0000 | ORAL_TABLET | Freq: Two times a day (BID) | ORAL | 0 refills | Status: DC

## 2020-10-28 MED FILL — AMOX-CLAV 875-125 MG TABLET: 875-125 | 4 days supply | Qty: 8 | Fill #0

## 2020-11-24 ENCOUNTER — Encounter: Payer: Self-pay | Admitting: Family

## 2020-12-20 ENCOUNTER — Encounter (INDEPENDENT_AMBULATORY_CARE_PROVIDER_SITE_OTHER): Payer: Self-pay | Admitting: Family Medicine

## 2020-12-20 ENCOUNTER — Ambulatory Visit (INDEPENDENT_AMBULATORY_CARE_PROVIDER_SITE_OTHER): Payer: 59 | Admitting: Family Medicine

## 2020-12-20 ENCOUNTER — Other Ambulatory Visit: Payer: Self-pay

## 2020-12-20 VITALS — BP 125/85 | HR 90 | Temp 98.2°F | Ht 65.0 in | Wt 226.0 lb

## 2020-12-20 DIAGNOSIS — E66812 Morbid (severe) obesity due to excess calories: Secondary | ICD-10-CM

## 2020-12-20 DIAGNOSIS — R5383 Other fatigue: Secondary | ICD-10-CM

## 2020-12-20 DIAGNOSIS — Z0289 Encounter for other administrative examinations: Secondary | ICD-10-CM

## 2020-12-20 DIAGNOSIS — E559 Vitamin D deficiency, unspecified: Secondary | ICD-10-CM

## 2020-12-20 DIAGNOSIS — Z6837 Body mass index (BMI) 37.0-37.9, adult: Secondary | ICD-10-CM | POA: Diagnosis not present

## 2020-12-20 DIAGNOSIS — Z1331 Encounter for screening for depression: Secondary | ICD-10-CM

## 2020-12-20 DIAGNOSIS — I10 Essential (primary) hypertension: Secondary | ICD-10-CM

## 2020-12-20 DIAGNOSIS — Z9189 Other specified personal risk factors, not elsewhere classified: Secondary | ICD-10-CM

## 2020-12-20 DIAGNOSIS — R0602 Shortness of breath: Secondary | ICD-10-CM

## 2020-12-21 LAB — CBC WITH DIFFERENTIAL/PLATELET
Basophils Absolute: 0.1 10*3/uL (ref 0.0–0.2)
Basos: 1 %
EOS (ABSOLUTE): 0.1 10*3/uL (ref 0.0–0.4)
Eos: 1 %
Hematocrit: 42.1 % (ref 34.0–46.6)
Hemoglobin: 12.9 g/dL (ref 11.1–15.9)
Immature Grans (Abs): 0.1 10*3/uL (ref 0.0–0.1)
Immature Granulocytes: 1 %
Lymphocytes Absolute: 2 10*3/uL (ref 0.7–3.1)
Lymphs: 21 %
MCH: 24.8 pg — ABNORMAL LOW (ref 26.6–33.0)
MCHC: 30.6 g/dL — ABNORMAL LOW (ref 31.5–35.7)
MCV: 81 fL (ref 79–97)
Monocytes Absolute: 0.6 10*3/uL (ref 0.1–0.9)
Monocytes: 7 %
Neutrophils Absolute: 6.5 10*3/uL (ref 1.4–7.0)
Neutrophils: 69 %
Platelets: 334 10*3/uL (ref 150–450)
RBC: 5.21 x10E6/uL (ref 3.77–5.28)
RDW: 14.1 % (ref 11.7–15.4)
WBC: 9.4 10*3/uL (ref 3.4–10.8)

## 2020-12-21 LAB — FOLATE: Folate: 16 ng/mL (ref >3.0)

## 2020-12-21 LAB — COMPREHENSIVE METABOLIC PANEL
ALT: 16 IU/L (ref 0–32)
AST: 17 IU/L (ref 0–40)
Albumin/Globulin Ratio: 1.8 (ref 1.2–2.2)
Albumin: 4.4 g/dL (ref 3.8–4.8)
Alkaline Phosphatase: 70 IU/L (ref 44–121)
BUN/Creatinine Ratio: 17 (ref 9–23)
BUN: 11 mg/dL (ref 6–24)
Bilirubin Total: 0.3 mg/dL (ref 0.0–1.2)
CO2: 24 mmol/L (ref 20–29)
Calcium: 9.1 mg/dL (ref 8.7–10.2)
Chloride: 101 mmol/L (ref 96–106)
Creatinine, Ser: 0.65 mg/dL (ref 0.57–1.00)
GFR calc Af Amer: 123 mL/min/{1.73_m2} (ref >59)
GFR calc non Af Amer: 107 mL/min/{1.73_m2} (ref >59)
Globulin, Total: 2.5 g/dL (ref 1.5–4.5)
Glucose: 88 mg/dL (ref 65–99)
Potassium: 4.1 mmol/L (ref 3.5–5.2)
Sodium: 138 mmol/L (ref 134–144)
Total Protein: 6.9 g/dL (ref 6.0–8.5)

## 2020-12-21 LAB — LIPID PANEL WITH LDL/HDL RATIO
Cholesterol, Total: 197 mg/dL (ref 100–199)
HDL: 43 mg/dL (ref >39)
LDL Chol Calc (NIH): 131 mg/dL — ABNORMAL HIGH (ref 0–99)
LDL/HDL Ratio: 3 ratio (ref 0.0–3.2)
Triglycerides: 129 mg/dL (ref 0–149)
VLDL Cholesterol Cal: 23 mg/dL (ref 5–40)

## 2020-12-21 LAB — INSULIN, RANDOM: INSULIN: 18.4 u[IU]/mL (ref 2.6–24.9)

## 2020-12-21 LAB — TSH: TSH: 2.07 u[IU]/mL (ref 0.450–4.500)

## 2020-12-21 LAB — HEMOGLOBIN A1C
Est. average glucose Bld gHb Est-mCnc: 108 mg/dL
Hgb A1c MFr Bld: 5.4 % (ref 4.8–5.6)

## 2020-12-21 LAB — T4: T4, Total: 7.9 ug/dL (ref 4.5–12.0)

## 2020-12-21 LAB — T3: T3, Total: 122 ng/dL (ref 71–180)

## 2020-12-21 LAB — VITAMIN B12: Vitamin B-12: 979 pg/mL (ref 232–1245)

## 2020-12-21 LAB — VITAMIN D 25 HYDROXY (VIT D DEFICIENCY, FRACTURES): Vit D, 25-Hydroxy: 48.7 ng/mL (ref 30.0–100.0)

## 2020-12-21 NOTE — Progress Notes (Signed)
Chief Complaint:   OBESITY Jessica Griffin (MR# [id number]) is a 45 y.o. female who presents for evaluation and treatment of obesity and related comorbidities. Current BMI is Body mass index is 37.61 kg/m. Jessica Griffin has been struggling with her weight for many years and has been unsuccessful in either losing weight, maintaining weight loss, or reaching her healthy weight goal.  Jessica Griffin is currently in the action stage of change and ready to dedicate time achieving and maintaining a healthier weight. Jessica Griffin is interested in becoming our patient and working on intensive lifestyle modifications including (but not limited to) diet and exercise for weight loss.  Jessica Griffin's habits were reviewed today and are as follows: Her family eats meals together, she thinks her family will eat healthier with her, her desired weight loss is 71 lbs, she has been heavy most of her life, she started gaining weight within the last 3 to 4 years, her heaviest weight ever was 227 pounds, she has significant food cravings issues, she skips meals frequently, she is frequently drinking liquids with calories, she frequently makes poor food choices, she frequently eats larger portions than normal and she struggles with emotional eating.  Depression Screen Jessica Griffin's Food and Mood (modified PHQ-9) score was 10.  Depression screen Jessica Griffin 2/9 12/20/2020  Decreased Interest 1  Down, Depressed, Hopeless 1  PHQ - 2 Score 2  Altered sleeping 3  Tired, decreased energy 3  Change in appetite 2  Feeling bad or failure about yourself  0  Trouble concentrating 0  Moving slowly or fidgety/restless 0  Suicidal thoughts 0  PHQ-9 Score 10   Subjective:   1. Other fatigue Jessica Griffin admits to daytime somnolence and admits to waking up still tired. Patent has a history of symptoms of daytime fatigue. Jessica Griffin generally gets 4 or 5 hours of sleep per night, and states that she has difficulty falling asleep. Snoring is present. Apneic episodes  are present. Epworth Sleepiness Score is 10.  2. Shortness of breath on exertion Jessica Griffin notes increasing shortness of breath with exercising and seems to be worsening over time with weight gain. She notes getting out of breath sooner with activity than she used to. This has not gotten worse recently. Jessica Griffin denies shortness of breath at rest or orthopnea.  3. Vitamin D deficiency Jessica Griffin is on Vit D OTC 5,000 IU daily. She is due for labs, and she notes fatigue.  4. Primary hypertension Jessica Griffin has a history of hypertension. Her blood pressure is well controlled today. She is working on diet and weight loss.  5. At risk for heart disease Jessica Griffin is at a higher than average risk for cardiovascular disease due to obesity.   Assessment/Plan:   1. Other fatigue Jessica Griffin does feel that her weight is causing her energy to be lower than it should be. Fatigue may be related to obesity, depression or many other causes. Labs will be ordered, and in the meanwhile, Jessica Griffin will focus on self care including making healthy food choices, increasing physical activity and focusing on stress reduction.  - Vitamin B12 - CBC with Differential/Platelet - EKG 12-Lead - Folate - Hemoglobin A1c - Insulin, random - Lipid Panel With LDL/HDL Ratio - T3 - T4 - TSH  2. Shortness of breath on exertion Jessica Griffin does feel that she gets out of breath more easily that she used to when she exercises. Jessica Griffin's shortness of breath appears to be obesity related and exercise induced. She has agreed to work on weight loss and  gradually increase exercise to treat her exercise induced shortness of breath. Will continue to monitor closely.  3. Vitamin D deficiency Low Vitamin D level contributes to fatigue and are associated with obesity, breast, and colon cancer. We will check labs today. Jessica Griffin agreed to continue taking OTC Vitamin D and will follow-up for routine testing of Vitamin D, at least 2-3 times per year to avoid  over-replacement.  - VITAMIN D 25 Hydroxy (Vit-D Deficiency, Fractures)  4. Primary hypertension Jessica Griffin agreed to start Category 2 plan, and will continue her medications and will continue working on healthy weight loss and exercise to improve blood pressure control. We will watch for signs of hypotension as she continues her lifestyle modifications. We will check labs today.  - Comprehensive metabolic panel  5. Screening for depression Jessica Griffin had a positive depression screening. Depression is commonly associated with obesity and often results in emotional eating behaviors. We will monitor this closely and work on CBT to help improve the non-hunger eating patterns. Referral to Psychology may be required if no improvement is seen as she continues in our clinic.  6. At risk for heart disease Jessica Griffin was given approximately 15 minutes of coronary artery disease prevention counseling today. She is 45 y.o. female and has risk factors for heart disease including obesity. We discussed intensive lifestyle modifications today with an emphasis on specific weight loss instructions and strategies.   Repetitive spaced learning was employed today to elicit superior memory formation and behavioral change.  7. Class 2 severe obesity with serious comorbidity and body mass index (BMI) of 37.0 to 37.9 in adult, unspecified obesity type Jessica Griffin) Jessica Griffin is currently in the action stage of change and her goal is to continue with weight loss efforts. I recommend Jessica Griffin begin the structured treatment plan as follows:  She has agreed to the Category 2 Plan + 100 calories.  Exercise goals: No exercise has been prescribed for now, while we concentrate on nutritional changes.  Behavioral modification strategies: no skipping meals and dealing with family or coworker sabotage.  She was informed of the importance of frequent follow-up visits to maximize her success with intensive lifestyle modifications for her multiple  health conditions. She was informed we would discuss her lab results at her next visit unless there is a critical issue that needs to be addressed sooner. Jessica Griffin agreed to keep her next visit at the agreed upon time to discuss these results.  Objective:   Blood pressure 125/85, pulse 90, temperature 98.2 F (36.8 C), height 5\' 5"  (1.651 m), weight 226 lb (102.5 kg), last menstrual period 11/17/2019, SpO2 97 %. Body mass index is 37.61 kg/m.  EKG: Normal sinus rhythm, rate 86 BPM.  Indirect Calorimeter completed today shows a VO2 of 262 and a REE of 1824.  Her calculated basal metabolic rate is 6789 thus her basal metabolic rate is better than expected.  General: Cooperative, alert, well developed, in no acute distress. HEENT: Conjunctivae and lids unremarkable. Cardiovascular: Regular rhythm.  Lungs: Normal work of breathing. Neurologic: No focal deficits.   Lab Results  Component Value Date   CREATININE 0.65 12/20/2020   BUN 11 12/20/2020   NA 138 12/20/2020   K 4.1 12/20/2020   CL 101 12/20/2020   CO2 24 12/20/2020   Lab Results  Component Value Date   ALT 16 12/20/2020   AST 17 12/20/2020   ALKPHOS 70 12/20/2020   BILITOT 0.3 12/20/2020   Lab Results  Component Value Date   HGBA1C 5.4 12/20/2020  Lab Results  Component Value Date   INSULIN 18.4 12/20/2020   Lab Results  Component Value Date   TSH 2.070 12/20/2020   Lab Results  Component Value Date   CHOL 197 12/20/2020   HDL 43 12/20/2020   LDLCALC 131 (H) 12/20/2020   TRIG 129 12/20/2020   Lab Results  Component Value Date   WBC 9.4 12/20/2020   HGB 12.9 12/20/2020   HCT 42.1 12/20/2020   MCV 81 12/20/2020   PLT 334 12/20/2020   Lab Results  Component Value Date   IRON 61 05/25/2020   TIBC 333 05/25/2020   FERRITIN 55 05/25/2020   Attestation Statements:   Reviewed by clinician on day of visit: allergies, medications, problem list, medical history, surgical history, family history, social  history, and previous encounter notes.   I, Trixie Dredge, am acting as transcriptionist for Dennard Nip, MD.  I have reviewed the above documentation for accuracy and completeness, and I agree with the above. - Dennard Nip, MD

## 2021-01-03 ENCOUNTER — Other Ambulatory Visit: Payer: Self-pay

## 2021-01-03 ENCOUNTER — Encounter (INDEPENDENT_AMBULATORY_CARE_PROVIDER_SITE_OTHER): Payer: Self-pay | Admitting: Family Medicine

## 2021-01-03 ENCOUNTER — Ambulatory Visit (INDEPENDENT_AMBULATORY_CARE_PROVIDER_SITE_OTHER): Payer: 59 | Admitting: Family Medicine

## 2021-01-03 VITALS — BP 145/86 | HR 102 | Temp 98.3°F | Ht 65.0 in | Wt 226.0 lb

## 2021-01-03 DIAGNOSIS — Z9189 Other specified personal risk factors, not elsewhere classified: Secondary | ICD-10-CM

## 2021-01-03 DIAGNOSIS — Z6837 Body mass index (BMI) 37.0-37.9, adult: Secondary | ICD-10-CM | POA: Diagnosis not present

## 2021-01-03 DIAGNOSIS — I10 Essential (primary) hypertension: Secondary | ICD-10-CM | POA: Diagnosis not present

## 2021-01-03 DIAGNOSIS — E8881 Metabolic syndrome: Secondary | ICD-10-CM | POA: Diagnosis not present

## 2021-01-04 NOTE — Progress Notes (Signed)
Chief Complaint:   OBESITY Jessica Griffin is here to discuss her progress with her obesity treatment plan along with follow-up of her obesity related diagnoses. Jessica Griffin is on the Category 2 Plan + 100 calories and states she is following her eating plan approximately 75% of the time. Jessica Griffin states she is doing 0 minutes 0 times per week.  Today's visit was #: 2 Starting weight: 226 lbs Starting date: 12/20/2020 Today's weight: 226 lbs Today's date: 01/03/2021 Total lbs lost to date: 0 Total lbs lost since last in-office visit: 0  Interim History: Jessica Griffin did well with starting her Category 2 plan. She made some substitutions, but overall did well. Her protein may not have been at goal.  Subjective:   1. Essential hypertension Avian's blood pressure is elevated today and worsening. She forgot some of her blood pressure medications. I discussed labs with the patient today.  2. Insulin resistance Jessica Griffin's fasting glucose and A1c are within normal limits, but her fasting insulin was elevated. This is likely contributing to her weight gain. I discussed labs with the patient today.  3. At risk for diabetes mellitus Jessica Griffin is at higher than average risk for developing diabetes due to obesity.   Assessment/Plan:   1. Essential hypertension Jessica Griffin will continue her medications, and we discussed ways to remember her medications more regularly. She will continue working on healthy weight loss and exercise to improve blood pressure control. We will watch for signs of hypotension as she continues her lifestyle modifications.  2. Insulin resistance Jessica Griffin will continue diet and exercise, and we will recheck labs in 3 months. She was educated on the importance of increasing lean protein and decreasing simple carbohydrates to help decrease the risk of diabetes. Jessica Griffin agreed to follow-up with [place] as directed to closely monitor her progress.  3. At risk for diabetes mellitus Jessica Griffin was given  approximately 30 minutes of diabetes education and counseling today. We discussed intensive lifestyle modifications today with an emphasis on weight loss as well as increasing exercise and decreasing simple carbohydrates in her diet. We also reviewed medication options with an emphasis on risk versus benefit of those discussed.   Repetitive spaced learning was employed today to elicit superior memory formation and behavioral change.  4. Class 2 severe obesity with serious comorbidity and body mass index (BMI) of 37.0 to 37.9 in adult, unspecified obesity type Jessica Griffin) Jessica Griffin is currently in the action stage of change. As such, her goal is to continue with weight loss efforts. She has agreed to the Category 2 Plan + 100 calories.   Behavioral modification strategies: increasing lean protein intake and meal planning and cooking strategies.  Jessica Griffin has agreed to follow-up with our clinic in 2 weeks. She was informed of the importance of frequent follow-up visits to maximize her success with intensive lifestyle modifications for her multiple health conditions.   Objective:   Blood pressure (!) 145/86, pulse (!) 102, temperature 98.3 F (36.8 C), height 5\' 5"  (1.651 m), weight 226 lb (102.5 kg), last menstrual period 11/17/2019, SpO2 99 %. Body mass index is 37.61 kg/m.  General: Cooperative, alert, well developed, in no acute distress. HEENT: Conjunctivae and lids unremarkable. Cardiovascular: Regular rhythm.  Lungs: Normal work of breathing. Neurologic: No focal deficits.   Lab Results  Component Value Date   CREATININE 0.65 12/20/2020   BUN 11 12/20/2020   NA 138 12/20/2020   K 4.1 12/20/2020   CL 101 12/20/2020   CO2 24 12/20/2020   Lab  Results  Component Value Date   ALT 16 12/20/2020   AST 17 12/20/2020   ALKPHOS 70 12/20/2020   BILITOT 0.3 12/20/2020   Lab Results  Component Value Date   HGBA1C 5.4 12/20/2020   Lab Results  Component Value Date   INSULIN 18.4 12/20/2020    Lab Results  Component Value Date   TSH 2.070 12/20/2020   Lab Results  Component Value Date   CHOL 197 12/20/2020   HDL 43 12/20/2020   LDLCALC 131 (H) 12/20/2020   TRIG 129 12/20/2020   Lab Results  Component Value Date   WBC 9.4 12/20/2020   HGB 12.9 12/20/2020   HCT 42.1 12/20/2020   MCV 81 12/20/2020   PLT 334 12/20/2020   Lab Results  Component Value Date   IRON 61 05/25/2020   TIBC 333 05/25/2020   FERRITIN 55 05/25/2020   Attestation Statements:   Reviewed by clinician on day of visit: allergies, medications, problem list, medical history, surgical history, family history, social history, and previous encounter notes.   I, Trixie Dredge, am acting as transcriptionist for Dennard Nip, MD.  I have reviewed the above documentation for accuracy and completeness, and I agree with the above. -  Dennard Nip, MD

## 2021-01-12 ENCOUNTER — Other Ambulatory Visit: Payer: Self-pay | Admitting: Family

## 2021-01-12 DIAGNOSIS — Z1231 Encounter for screening mammogram for malignant neoplasm of breast: Secondary | ICD-10-CM

## 2021-01-23 ENCOUNTER — Other Ambulatory Visit (HOSPITAL_BASED_OUTPATIENT_CLINIC_OR_DEPARTMENT_OTHER): Payer: Self-pay | Admitting: Family Medicine

## 2021-01-23 ENCOUNTER — Ambulatory Visit (INDEPENDENT_AMBULATORY_CARE_PROVIDER_SITE_OTHER): Payer: 59 | Admitting: Family Medicine

## 2021-01-23 ENCOUNTER — Encounter (INDEPENDENT_AMBULATORY_CARE_PROVIDER_SITE_OTHER): Payer: Self-pay | Admitting: Family Medicine

## 2021-01-23 ENCOUNTER — Other Ambulatory Visit: Payer: Self-pay

## 2021-01-23 VITALS — BP 136/84 | HR 98 | Temp 98.5°F | Ht 65.0 in | Wt 224.0 lb

## 2021-01-23 DIAGNOSIS — Z6837 Body mass index (BMI) 37.0-37.9, adult: Secondary | ICD-10-CM | POA: Diagnosis not present

## 2021-01-23 DIAGNOSIS — I10 Essential (primary) hypertension: Secondary | ICD-10-CM | POA: Diagnosis not present

## 2021-01-23 DIAGNOSIS — Z9189 Other specified personal risk factors, not elsewhere classified: Secondary | ICD-10-CM

## 2021-01-23 DIAGNOSIS — E559 Vitamin D deficiency, unspecified: Secondary | ICD-10-CM | POA: Diagnosis not present

## 2021-01-23 MED ORDER — PEN NEEDLES 32G X 4 MM MISC
0 refills | Status: DC
Start: 2021-01-23 — End: 2021-04-11

## 2021-01-23 MED ORDER — SAXENDA 18 MG/3ML ~~LOC~~ SOPN: 3.0000 mg | PEN_INJECTOR | Freq: Every day | SUBCUTANEOUS | 0 refills | Status: DC

## 2021-01-24 ENCOUNTER — Telehealth (INDEPENDENT_AMBULATORY_CARE_PROVIDER_SITE_OTHER): Payer: Self-pay

## 2021-01-24 NOTE — Telephone Encounter (Signed)
PA has been initiated via [email or web address] for Saxenda 18mg /48mL.  Key: [id number] Rx #: [id number] Saxenda 18MG Fayne Mediate pen-injectors   Form: Med[employer] Medication Request Form  Determination: Wait for Determination Please wait for MedImpact 2017 to return a determination.

## 2021-01-24 NOTE — Progress Notes (Signed)
Chief Complaint:   OBESITY Jessica Griffin is here to discuss her progress with her obesity treatment plan along with follow-up of her obesity related diagnoses. Jessica Griffin is on the Category 2 Plan + 100 calories and states she is following her eating plan approximately 50% of the time. Jessica Griffin states she is doing 0 minutes 0 times per week.  Today's visit was #: 3 Starting weight: 226 lbs Starting date: 12/20/2020 Today's weight: 224 lbs Today's date: 01/23/2021 Total lbs lost to date: 2 Total lbs lost since last in-office visit: 2  Interim History: Jessica Griffin is struggling with hunger issues. She occasionally has cravings and meal planning has been especially difficult.  Subjective:   1. Vitamin D deficiency Jessica Griffin is on Vit D OTC, and her level is not yet at goal. She denies nausea or vomiting.  2. Essential hypertension Jessica Griffin's blood pressure is stable but a bit higher than normal. She is working longer hours with some increased stress.  3. At risk for impaired metabolic function Jessica Griffin is at increased risk for impaired metabolic function if her protein decreases.  Assessment/Plan:   1. Vitamin D deficiency Low Vitamin D level contributes to fatigue and are associated with obesity, breast, and colon cancer. Jessica Griffin agreed to continue taking OTC Vitamin D, and we will recheck labs in 2 months. She will follow-up for routine testing of Vitamin D, at least 2-3 times per year to avoid over-replacement.  2. Essential hypertension Jessica Griffin will continue her medications, and will continue working on healthy weight loss and exercise to improve blood pressure control. We will continue to monitor, and watch for signs of hypotension as she continues her lifestyle modifications.   3. At risk for impaired metabolic function Jessica Griffin was given approximately 15 minutes of impaired  metabolic function prevention counseling today. We discussed intensive lifestyle modifications today with an emphasis on  specific nutrition and exercise instructions and strategies.   Repetitive spaced learning was employed today to elicit superior memory formation and behavioral change.  4. Class 2 severe obesity with serious comorbidity and body mass index (BMI) of 37.0 to 37.9 in adult, unspecified obesity type Jessica Griffin) Jessica Griffin is currently in the action stage of change. As such, her goal is to continue with weight loss efforts. She has agreed to the Category 2 Plan and keeping a food journal and adhering to recommended goals of 350-550 calories and 30+ grams of protein at supper daily.   We discussed various medication options to help Jessica Griffin with her weight loss efforts and we both agreed to start Saxenda 3.0 mg q AM with no refills, and nano needles #100 with no refills (she is to start at 0.6 mg).  - Liraglutide -Weight Management (SAXENDA) 18 MG/3ML SOPN; Inject 3 mg into the skin daily.  Dispense: 15 mL; Refill: 0 - Insulin Pen Needle (PEN NEEDLES) 32G X 4 MM MISC; Use one pen needle once daily to inject Saxenda.  Dispense: 100 each; Refill: 0  Behavioral modification strategies: increasing lean protein intake.  Jessica Griffin has agreed to follow-up with our clinic in 2 weeks. She was informed of the importance of frequent follow-up visits to maximize her success with intensive lifestyle modifications for her multiple health conditions.   Objective:   Blood pressure 136/84, pulse 98, temperature 98.5 F (36.9 C), height 5\' 5"  (1.651 m), weight 224 lb (101.6 kg), last menstrual period 11/17/2019, SpO2 99 %. Body mass index is 37.28 kg/m.  General: Cooperative, alert, well developed, in no acute distress. HEENT:  Conjunctivae and lids unremarkable. Cardiovascular: Regular rhythm.  Lungs: Normal work of breathing. Neurologic: No focal deficits.   Lab Results  Component Value Date   CREATININE 0.65 12/20/2020   BUN 11 12/20/2020   NA 138 12/20/2020   K 4.1 12/20/2020   CL 101 12/20/2020   CO2 24 12/20/2020    Lab Results  Component Value Date   ALT 16 12/20/2020   AST 17 12/20/2020   ALKPHOS 70 12/20/2020   BILITOT 0.3 12/20/2020   Lab Results  Component Value Date   HGBA1C 5.4 12/20/2020   Lab Results  Component Value Date   INSULIN 18.4 12/20/2020   Lab Results  Component Value Date   TSH 2.070 12/20/2020   Lab Results  Component Value Date   CHOL 197 12/20/2020   HDL 43 12/20/2020   LDLCALC 131 (H) 12/20/2020   TRIG 129 12/20/2020   Lab Results  Component Value Date   WBC 9.4 12/20/2020   HGB 12.9 12/20/2020   HCT 42.1 12/20/2020   MCV 81 12/20/2020   PLT 334 12/20/2020   Lab Results  Component Value Date   IRON 61 05/25/2020   TIBC 333 05/25/2020   FERRITIN 55 05/25/2020   Attestation Statements:   Reviewed by clinician on day of visit: allergies, medications, problem list, medical history, surgical history, family history, social history, and previous encounter notes.   I, Trixie Dredge, am acting as transcriptionist for Dennard Nip, MD.  I have reviewed the above documentation for accuracy and completeness, and I agree with the above. -  Dennard Nip, MD

## 2021-01-27 ENCOUNTER — Encounter (INDEPENDENT_AMBULATORY_CARE_PROVIDER_SITE_OTHER): Payer: Self-pay | Admitting: Family Medicine

## 2021-02-01 MED FILL — ULTICARE PEN NDL 4MM 32G: 32G X 4 MM | 90 days supply | Qty: 100 | Fill #0

## 2021-02-01 MED FILL — SAXENDA 18 MG/3 ML PEN: 18 | 30 days supply | Qty: 15 | Fill #0

## 2021-02-02 ENCOUNTER — Other Ambulatory Visit: Payer: Self-pay | Admitting: Family

## 2021-02-02 MED FILL — LOSARTAN-HCTZ 100-25 MG TAB: 100-25 | 90 days supply | Qty: 90 | Fill #0

## 2021-02-02 MED FILL — POTASSIUM CHLORIDE CRYS ER: 20 | 90 days supply | Qty: 90 | Fill #0

## 2021-02-02 NOTE — Telephone Encounter (Signed)
PA for Jessica Griffin has been approved. Approval is good for 4 fills from 01/31/2021 through 06/01/2021 as long as pt remains covered under current plan.

## 2021-02-08 ENCOUNTER — Ambulatory Visit (INDEPENDENT_AMBULATORY_CARE_PROVIDER_SITE_OTHER): Payer: 59 | Admitting: Family Medicine

## 2021-02-13 ENCOUNTER — Encounter (INDEPENDENT_AMBULATORY_CARE_PROVIDER_SITE_OTHER): Payer: Self-pay | Admitting: Physician Assistant

## 2021-02-13 ENCOUNTER — Other Ambulatory Visit: Payer: Self-pay

## 2021-02-13 ENCOUNTER — Ambulatory Visit (INDEPENDENT_AMBULATORY_CARE_PROVIDER_SITE_OTHER): Payer: 59 | Admitting: Physician Assistant

## 2021-02-13 VITALS — BP 136/85 | HR 85 | Temp 98.1°F | Ht 65.0 in | Wt 219.0 lb

## 2021-02-13 DIAGNOSIS — E8881 Metabolic syndrome: Secondary | ICD-10-CM

## 2021-02-13 DIAGNOSIS — I10 Essential (primary) hypertension: Secondary | ICD-10-CM | POA: Diagnosis not present

## 2021-02-13 DIAGNOSIS — Z6837 Body mass index (BMI) 37.0-37.9, adult: Secondary | ICD-10-CM | POA: Diagnosis not present

## 2021-02-16 NOTE — Progress Notes (Signed)
Chief Complaint:   OBESITY Jessica Griffin is here to discuss her progress with her obesity treatment plan along with follow-up of her obesity related diagnoses. Jessica Griffin is on the Category 2 Plan and keeping a food journal and adhering to recommended goals of 350-550 calories and 30+ grams of protein at supper daily and states she is following her eating plan approximately 60-70% of the time. Jessica Griffin states she is doing 0 minutes 0 times per week.  Today's visit was #: 4 Starting weight: 226 lbs Starting date: 12/20/2020 Today's weight: 219 lbs Today's date: 02/13/2021 Total lbs lost to date: 7 Total lbs lost since last in-office visit: 5  Interim History: Jessica Griffin is on 0.6 mg of Saxenda and her hunger is now controlled. She is using the Lose It app and she is averaging 1400 calories, but not always getting enough protein. She wants to start walking.  Subjective:   1. Insulin resistance Jessica Griffin denies polyphagia since starting Saxenda. She wants to walk for exercise.  2. Essential hypertension Jessica Griffin's blood pressure is controlled. She is on Hyzaar, and she denies chest pain or headache. She is managed by her primary care provider.  Assessment/Plan:   1. Insulin resistance Jessica Griffin will continue her medications and meal plan, and will start walking. She will continue to work on weight loss and decreasing simple carbohydrates to help decrease the risk of diabetes. Jessica Griffin agreed to follow-up with Jessica Griffin as directed to closely monitor her progress.  2. Essential hypertension Jessica Griffin will continue her medications and will follow up with her primary care provider. She will continue working on healthy weight loss and exercise to improve blood pressure control. We will watch for signs of hypotension as she continues her lifestyle modifications.  3. Class 2 severe obesity with serious comorbidity and body mass index (BMI) of 37.0 to 37.9 in adult, unspecified obesity type Jessica Griffin) Jessica Griffin is currently in the  action stage of change. As such, her goal is to continue with weight loss efforts. She has agreed to keeping a food journal and adhering to recommended goals of 1300-1400 calories and 90 grams of protein daily.   Exercise goals: Jessica Griffin walking for exercise.  Behavioral modification strategies: increasing lean protein intake and meal planning and cooking strategies.  Jessica Griffin has agreed to follow-up with our clinic in 2 weeks. She was informed of the importance of frequent follow-up visits to maximize her success with intensive lifestyle modifications for her multiple health conditions.   Objective:   Blood pressure 136/85, pulse 85, temperature 98.1 F (36.7 C), height 5\' 5"  (1.651 m), weight 219 lb (99.3 kg), last menstrual period 11/17/2019, SpO2 98 %. Body mass index is 36.44 kg/m.  General: Cooperative, alert, well developed, in no acute distress. HEENT: Conjunctivae and lids unremarkable. Cardiovascular: Regular rhythm.  Lungs: Normal work of breathing. Neurologic: No focal deficits.   Lab Results  Component Value Date   CREATININE 0.65 12/20/2020   BUN 11 12/20/2020   NA 138 12/20/2020   K 4.1 12/20/2020   CL 101 12/20/2020   CO2 24 12/20/2020   Lab Results  Component Value Date   ALT 16 12/20/2020   AST 17 12/20/2020   ALKPHOS 70 12/20/2020   BILITOT 0.3 12/20/2020   Lab Results  Component Value Date   HGBA1C 5.4 12/20/2020   Lab Results  Component Value Date   INSULIN 18.4 12/20/2020   Lab Results  Component Value Date   TSH 2.070 12/20/2020   Lab Results  Component Value  Date   CHOL 197 12/20/2020   HDL 43 12/20/2020   LDLCALC 131 (H) 12/20/2020   TRIG 129 12/20/2020   Lab Results  Component Value Date   WBC 9.4 12/20/2020   HGB 12.9 12/20/2020   HCT 42.1 12/20/2020   MCV 81 12/20/2020   PLT 334 12/20/2020   Lab Results  Component Value Date   IRON 61 05/25/2020   TIBC 333 05/25/2020   FERRITIN 55 05/25/2020   Attestation Statements:    Reviewed by clinician on day of visit: allergies, medications, problem list, medical history, surgical history, family history, social history, and previous encounter notes.  Time spent on visit including pre-visit chart review and post-visit care and charting was 30 minutes.    Jessica Griffin, am acting as transcriptionist for [employer], PA-C.  I have reviewed the above documentation for accuracy and completeness, and I agree with the above. Jessica Potash, PA-C

## 2021-02-22 ENCOUNTER — Ambulatory Visit (INDEPENDENT_AMBULATORY_CARE_PROVIDER_SITE_OTHER): Payer: 59 | Admitting: Family Medicine

## 2021-02-27 ENCOUNTER — Ambulatory Visit (INDEPENDENT_AMBULATORY_CARE_PROVIDER_SITE_OTHER): Payer: 59 | Admitting: Family Medicine

## 2021-02-27 ENCOUNTER — Encounter (INDEPENDENT_AMBULATORY_CARE_PROVIDER_SITE_OTHER): Payer: Self-pay | Admitting: Family Medicine

## 2021-02-27 ENCOUNTER — Other Ambulatory Visit: Payer: Self-pay

## 2021-02-27 ENCOUNTER — Other Ambulatory Visit (HOSPITAL_BASED_OUTPATIENT_CLINIC_OR_DEPARTMENT_OTHER): Payer: Self-pay

## 2021-02-27 VITALS — BP 127/81 | HR 86 | Temp 97.9°F | Ht 65.0 in | Wt 215.0 lb

## 2021-02-27 DIAGNOSIS — I10 Essential (primary) hypertension: Secondary | ICD-10-CM

## 2021-02-27 DIAGNOSIS — Z6837 Body mass index (BMI) 37.0-37.9, adult: Secondary | ICD-10-CM | POA: Diagnosis not present

## 2021-02-27 MED ORDER — LIRAGLUTIDE -WEIGHT MANAGEMENT 18 MG/3ML ~~LOC~~ SOPN
3.0000 mg | PEN_INJECTOR | Freq: Every day | SUBCUTANEOUS | 0 refills | Status: DC
  Filled 2021-02-27: qty 15, 30d supply, fill #0

## 2021-03-03 ENCOUNTER — Ambulatory Visit
Admission: RE | Admit: 2021-03-03 | Discharge: 2021-03-03 | Disposition: A | Discharge status: Home / Self Care | Payer: 59 | Source: Ambulatory Visit | Attending: Family | Admitting: Family

## 2021-03-03 ENCOUNTER — Other Ambulatory Visit: Payer: Self-pay

## 2021-03-03 DIAGNOSIS — Z1231 Encounter for screening mammogram for malignant neoplasm of breast: Secondary | ICD-10-CM

## 2021-03-14 NOTE — Progress Notes (Signed)
Chief Complaint:   OBESITY Jazlynn is here to discuss her progress with her obesity treatment plan along with follow-up of her obesity related diagnoses. Ronnett is on keeping a food journal and adhering to recommended goals of 1300-1400 calories and 90 grams of protein daily and states she is following her eating plan approximately 80% of the time. Clark states she is doing 0 minutes 0 times per week.  Today's visit was #: 5 Starting weight: 226 lbs Starting date: 12/20/2020 Today's weight: 215 lbs Today's date: 02/27/2021 Total lbs lost to date: 11 Total lbs lost since last in-office visit: 4  Interim History: Rochele continues to do well with weight loss. She struggles to eat all of her food on her days off now that she is on Saxenda. She is working on increasing motivation to start exercising.  Subjective:   1. Essential hypertension Ximena's blood pressure is stable on her medications and she is doing well with weight loss. She denies chest pain.  Assessment/Plan:   1. Essential hypertension Keliah will continue her medications and diet to improve blood pressure control. Will continue to monitor for signs of hypotension as she continues her lifestyle modifications.  2. Obesity with current BMI of 35.8 Kristian is currently in the action stage of change. As such, her goal is to continue with weight loss efforts. She has agreed to the Category 2 Plan.   We discussed various medication options to help Terriana with her weight loss efforts and we both agreed to continue Saxenda, and we will refill for 1 month.  - Liraglutide -Weight Management 18 MG/3ML SOPN; INJECT 3 MG INTO THE SKIN DAILY.  Dispense: 15 mL; Refill: 0  Exercise goals: Work exercises and NEAT was discussed.  Behavioral modification strategies: increasing vegetables and no skipping meals.  Greyson has agreed to follow-up with our clinic in 4 weeks. She was informed of the importance of frequent follow-up visits to  maximize her success with intensive lifestyle modifications for her multiple health conditions.   Objective:   Blood pressure 127/81, pulse 86, temperature 97.9 F (36.6 C), height 5\' 5"  (1.651 m), weight 215 lb (97.5 kg), last menstrual period 11/17/2019, SpO2 98 %. Body mass index is 35.78 kg/m.  General: Cooperative, alert, well developed, in no acute distress. HEENT: Conjunctivae and lids unremarkable. Cardiovascular: Regular rhythm.  Lungs: Normal work of breathing. Neurologic: No focal deficits.   Lab Results  Component Value Date   CREATININE 0.65 12/20/2020   BUN 11 12/20/2020   NA 138 12/20/2020   K 4.1 12/20/2020   CL 101 12/20/2020   CO2 24 12/20/2020   Lab Results  Component Value Date   ALT 16 12/20/2020   AST 17 12/20/2020   ALKPHOS 70 12/20/2020   BILITOT 0.3 12/20/2020   Lab Results  Component Value Date   HGBA1C 5.4 12/20/2020   Lab Results  Component Value Date   INSULIN 18.4 12/20/2020   Lab Results  Component Value Date   TSH 2.070 12/20/2020   Lab Results  Component Value Date   CHOL 197 12/20/2020   HDL 43 12/20/2020   LDLCALC 131 (H) 12/20/2020   TRIG 129 12/20/2020   Lab Results  Component Value Date   WBC 9.4 12/20/2020   HGB 12.9 12/20/2020   HCT 42.1 12/20/2020   MCV 81 12/20/2020   PLT 334 12/20/2020   Lab Results  Component Value Date   IRON 61 05/25/2020   TIBC 333 05/25/2020   FERRITIN  65 05/25/2020   Attestation Statements:   Reviewed by clinician on day of visit: allergies, medications, problem list, medical history, surgical history, family history, social history, and previous encounter notes.  Time spent on visit including pre-visit chart review and post-visit care and charting was 30 minutes.    I, Trixie Dredge, am acting as transcriptionist for Dennard Nip, MD.  I have reviewed the above documentation for accuracy and completeness, and I agree with the above. -  Dennard Nip, MD

## 2021-03-27 ENCOUNTER — Other Ambulatory Visit: Payer: Self-pay

## 2021-03-27 ENCOUNTER — Encounter (INDEPENDENT_AMBULATORY_CARE_PROVIDER_SITE_OTHER): Payer: Self-pay | Admitting: Physician Assistant

## 2021-03-27 ENCOUNTER — Ambulatory Visit (INDEPENDENT_AMBULATORY_CARE_PROVIDER_SITE_OTHER): Payer: 59 | Admitting: Physician Assistant

## 2021-03-27 VITALS — BP 132/82 | HR 89 | Temp 98.5°F | Ht 65.0 in | Wt 218.0 lb

## 2021-03-27 DIAGNOSIS — E559 Vitamin D deficiency, unspecified: Secondary | ICD-10-CM | POA: Diagnosis not present

## 2021-03-27 DIAGNOSIS — Z6835 Body mass index (BMI) 35.0-35.9, adult: Secondary | ICD-10-CM | POA: Diagnosis not present

## 2021-03-28 NOTE — Progress Notes (Signed)
Chief Complaint:   OBESITY Jessica Griffin is here to discuss her progress with her obesity treatment plan along with follow-up of her obesity related diagnoses. Jessica Griffin is on the Category 2 Plan and states she is following her eating plan approximately 50% of the time. Jessica Griffin states she is doing 0 minutes 0 times per week.  Today's visit was #: 6 Starting weight: 226 lbs Starting date: 12/20/2020 Today's weight: 218 lbs Today's date: 03/27/2021 Total lbs lost to date: 8 Total lbs lost since last in-office visit: 0  Interim History: Jessica Griffin has worked 20 days straight. She may or may not eat lunch and she is skipping dinner. She does not eat cold cuts or microwave meals. She is on 0.6 mg of Saxenda.  Subjective:   1. Vitamin D deficiency Jessica Griffin is on Vit D, and she denies nausea, vomiting, or muscle weakness.  Assessment/Plan:   1. Vitamin D deficiency Low Vitamin D level contributes to fatigue and are associated with obesity, breast, and colon cancer. Jessica Griffin agreed to continue taking Vitamin D 5,000 IU daily and will follow-up for routine testing of Vitamin D, at least 2-3 times per year to avoid over-replacement.  2. Class 2 severe obesity with serious comorbidity and body mass index (BMI) of 35.0 to 35.9 in adult, unspecified obesity type Jessica Griffin) Jessica Griffin is currently in the action stage of change. As such, her goal is to continue with weight loss efforts. She has agreed to the Category 2 Plan.   We will recheck labs at her next visit.  Exercise goals: No exercise has been prescribed at this time.  Behavioral modification strategies: meal planning and cooking strategies and keeping healthy foods in the home.  Jessica Griffin has agreed to follow-up with our clinic in 2 weeks. She was informed of the importance of frequent follow-up visits to maximize her success with intensive lifestyle modifications for her multiple health conditions.   Objective:   Blood pressure 132/82, pulse 89, temperature  98.5 F (36.9 C), height 5\' 5"  (1.651 m), weight 218 lb (98.9 kg), last menstrual period 11/17/2019, SpO2 97 %. Body mass index is 36.28 kg/m.  General: Cooperative, alert, well developed, in no acute distress. HEENT: Conjunctivae and lids unremarkable. Cardiovascular: Regular rhythm.  Lungs: Normal work of breathing. Neurologic: No focal deficits.   Lab Results  Component Value Date   CREATININE 0.65 12/20/2020   BUN 11 12/20/2020   NA 138 12/20/2020   K 4.1 12/20/2020   CL 101 12/20/2020   CO2 24 12/20/2020   Lab Results  Component Value Date   ALT 16 12/20/2020   AST 17 12/20/2020   ALKPHOS 70 12/20/2020   BILITOT 0.3 12/20/2020   Lab Results  Component Value Date   HGBA1C 5.4 12/20/2020   Lab Results  Component Value Date   INSULIN 18.4 12/20/2020   Lab Results  Component Value Date   TSH 2.070 12/20/2020   Lab Results  Component Value Date   CHOL 197 12/20/2020   HDL 43 12/20/2020   LDLCALC 131 (H) 12/20/2020   TRIG 129 12/20/2020   Lab Results  Component Value Date   WBC 9.4 12/20/2020   HGB 12.9 12/20/2020   HCT 42.1 12/20/2020   MCV 81 12/20/2020   PLT 334 12/20/2020   Lab Results  Component Value Date   IRON 61 05/25/2020   TIBC 333 05/25/2020   FERRITIN 55 05/25/2020   Attestation Statements:   Reviewed by clinician on day of visit: allergies, medications, problem  list, medical history, surgical history, family history, social history, and previous encounter notes.  Time spent on visit including pre-visit chart review and post-visit care and charting was 30 minutes.    Wilhemena Durie, am acting as transcriptionist for [employer], PA-C.  I have reviewed the above documentation for accuracy and completeness, and I agree with the above. Abby Potash, PA-C

## 2021-04-11 ENCOUNTER — Other Ambulatory Visit (HOSPITAL_BASED_OUTPATIENT_CLINIC_OR_DEPARTMENT_OTHER): Payer: Self-pay

## 2021-04-11 ENCOUNTER — Ambulatory Visit (INDEPENDENT_AMBULATORY_CARE_PROVIDER_SITE_OTHER): Payer: 59 | Admitting: Physician Assistant

## 2021-04-11 ENCOUNTER — Encounter (INDEPENDENT_AMBULATORY_CARE_PROVIDER_SITE_OTHER): Payer: Self-pay | Admitting: Physician Assistant

## 2021-04-11 ENCOUNTER — Other Ambulatory Visit: Payer: Self-pay

## 2021-04-11 VITALS — BP 124/85 | HR 88 | Temp 98.3°F | Ht 65.0 in | Wt 212.0 lb

## 2021-04-11 DIAGNOSIS — Z9189 Other specified personal risk factors, not elsewhere classified: Secondary | ICD-10-CM

## 2021-04-11 DIAGNOSIS — E7849 Other hyperlipidemia: Secondary | ICD-10-CM

## 2021-04-11 DIAGNOSIS — R739 Hyperglycemia, unspecified: Secondary | ICD-10-CM | POA: Diagnosis not present

## 2021-04-11 DIAGNOSIS — R7309 Other abnormal glucose: Secondary | ICD-10-CM | POA: Diagnosis not present

## 2021-04-11 DIAGNOSIS — Z6835 Body mass index (BMI) 35.0-35.9, adult: Secondary | ICD-10-CM | POA: Diagnosis not present

## 2021-04-11 DIAGNOSIS — E559 Vitamin D deficiency, unspecified: Secondary | ICD-10-CM

## 2021-04-11 DIAGNOSIS — E785 Hyperlipidemia, unspecified: Secondary | ICD-10-CM | POA: Diagnosis not present

## 2021-04-11 MED ORDER — INSULIN PEN NEEDLE 32G X 4 MM MISC
0 refills | Status: DC
  Filled 2021-04-11 – 2021-04-17 (×2): qty 100, 90d supply, fill #0

## 2021-04-11 NOTE — Progress Notes (Signed)
Chief Complaint:   OBESITY Jessica Griffin is here to discuss her progress with her obesity treatment plan along with follow-up of her obesity related diagnoses. Ernestina is on the Category 2 Plan and states she is following her eating plan approximately 75% of the time. Arlean states she is doing 0 minutes 0 times per week.  Today's visit was #: 7 Starting weight: 226 lbs Starting date: 12/20/2020 Today's weight: 212 lbs Today's date: 04/11/2021 Total lbs lost to date: 14 Total lbs lost since last in-office visit: 6  Interim History: Jessica Griffin did very well with weight loss. She is no longer skipping meals. She is on Saxenda 0.6 mg. She is eating her dinner portion for lunch.  Subjective:   1. Hyperglycemia Jessica Griffin denies polyphagia, and she is not exercising yet.  2. Vitamin D deficiency Jessica Griffin is on Vit D OTC daily, and she is tolerating it well.  3. Other hyperlipidemia Jessica Griffin denies chest pain, and she is not on medications.  4. At risk for diabetes mellitus Jessica Griffin is at higher than average risk for developing diabetes due to obesity.   Assessment/Plan:   1. Hyperglycemia We will check labs today. Britlee will continue to follow up as directed.  - Comprehensive metabolic panel - Hemoglobin A1c - Insulin, random  2. Vitamin D deficiency Low Vitamin D level contributes to fatigue and are associated with obesity, breast, and colon cancer. We will check labs today. Ellsie will continue OTC Vitamin D and will follow-up for routine testing of Vitamin D, at least 2-3 times per year to avoid over-replacement.  - VITAMIN D 25 Hydroxy (Vit-D Deficiency, Fractures)  3. Other hyperlipidemia Cardiovascular risk and specific lipid/LDL goals reviewed. We discussed several lifestyle modifications today. We will check labs today. Michalene will continue to work on diet, exercise and weight loss efforts. Orders and follow up as documented in patient record.   Counseling Intensive lifestyle  modifications are the first line treatment for this issue. . Dietary changes: Increase soluble fiber. Decrease simple carbohydrates. . Exercise changes: Moderate to vigorous-intensity aerobic activity 150 minutes per week if tolerated. . Lipid-lowering medications: see documented in medical record.  - Lipid panel  4. At risk for diabetes mellitus Jessica Griffin was given approximately 15 minutes of diabetes education and counseling today. We discussed intensive lifestyle modifications today with an emphasis on weight loss as well as increasing exercise and decreasing simple carbohydrates in her diet. We also reviewed medication options with an emphasis on risk versus benefit of those discussed.   Repetitive spaced learning was employed today to elicit superior memory formation and behavioral change.  5. Class 2 severe obesity with serious comorbidity and body mass index (BMI) of 35.0 to 35.9 in adult, unspecified obesity type [facility]) Jessica Griffin is currently in the action stage of change. As such, her goal is to continue with weight loss efforts. She has agreed to the Category 2 Plan.   We will refill pen needles #100 with no refills.   - Insulin Pen Needle 32G X 4 MM MISC; USE ONE PEN NEEDLE ONCE DAILY TO INJECT SAXENDA  Dispense: 100 each; Refill: 0  Exercise goals: No exercise has been prescribed at this time.  Behavioral modification strategies: meal planning and cooking strategies and planning for success.  Jessica Griffin has agreed to follow-up with our clinic in 2 weeks. She was informed of the importance of frequent follow-up visits to maximize her success with intensive lifestyle modifications for her multiple health conditions.   Jessica Griffin was informed  we would discuss her lab results at her next visit unless there is a critical issue that needs to be addressed sooner. Jessica Griffin agreed to keep her next visit at the agreed upon time to discuss these results.  Objective:   Blood pressure 124/85, pulse 88,  temperature 98.3 F (36.8 C), height 5\' 5"  (1.651 m), weight 212 lb (96.2 kg), last menstrual period 11/17/2019, SpO2 97 %. Body mass index is 35.28 kg/m.  General: Cooperative, alert, well developed, in no acute distress. HEENT: Conjunctivae and lids unremarkable. Cardiovascular: Regular rhythm.  Lungs: Normal work of breathing. Neurologic: No focal deficits.   Lab Results  Component Value Date   CREATININE 0.65 12/20/2020   BUN 11 12/20/2020   NA 138 12/20/2020   K 4.1 12/20/2020   CL 101 12/20/2020   CO2 24 12/20/2020   Lab Results  Component Value Date   ALT 16 12/20/2020   AST 17 12/20/2020   ALKPHOS 70 12/20/2020   BILITOT 0.3 12/20/2020   Lab Results  Component Value Date   HGBA1C 5.4 12/20/2020   Lab Results  Component Value Date   INSULIN 18.4 12/20/2020   Lab Results  Component Value Date   TSH 2.070 12/20/2020   Lab Results  Component Value Date   CHOL 197 12/20/2020   HDL 43 12/20/2020   LDLCALC 131 (H) 12/20/2020   TRIG 129 12/20/2020   Lab Results  Component Value Date   WBC 9.4 12/20/2020   HGB 12.9 12/20/2020   HCT 42.1 12/20/2020   MCV 81 12/20/2020   PLT 334 12/20/2020   Lab Results  Component Value Date   IRON 61 05/25/2020   TIBC 333 05/25/2020   FERRITIN 55 05/25/2020   Attestation Statements:   Reviewed by clinician on day of visit: allergies, medications, problem list, medical history, surgical history, family history, social history, and previous encounter notes.   Wilhemena Durie, am acting as transcriptionist for [employer], PA-C.  I have reviewed the above documentation for accuracy and completeness, and I agree with the above. Abby Potash, PA-C

## 2021-04-12 LAB — COMPREHENSIVE METABOLIC PANEL
ALT: 21 IU/L (ref 0–32)
AST: 17 IU/L (ref 0–40)
Albumin/Globulin Ratio: 1.7 (ref 1.2–2.2)
Albumin: 4.4 g/dL (ref 3.8–4.8)
Alkaline Phosphatase: 78 IU/L (ref 44–121)
BUN/Creatinine Ratio: 16 (ref 9–23)
BUN: 12 mg/dL (ref 6–24)
Bilirubin Total: 0.5 mg/dL (ref 0.0–1.2)
CO2: 24 mmol/L (ref 20–29)
Calcium: 9.3 mg/dL (ref 8.7–10.2)
Chloride: 100 mmol/L (ref 96–106)
Creatinine, Ser: 0.77 mg/dL (ref 0.57–1.00)
Globulin, Total: 2.6 g/dL (ref 1.5–4.5)
Glucose: 84 mg/dL (ref 65–99)
Potassium: 4 mmol/L (ref 3.5–5.2)
Sodium: 138 mmol/L (ref 134–144)
Total Protein: 7 g/dL (ref 6.0–8.5)
eGFR: 96 mL/min/{1.73_m2} (ref >59)

## 2021-04-12 LAB — LIPID PANEL
Chol/HDL Ratio: 4.9 ratio — ABNORMAL HIGH (ref 0.0–4.4)
Cholesterol, Total: 191 mg/dL (ref 100–199)
HDL: 39 mg/dL — ABNORMAL LOW (ref >39)
LDL Chol Calc (NIH): 128 mg/dL — ABNORMAL HIGH (ref 0–99)
Triglycerides: 135 mg/dL (ref 0–149)
VLDL Cholesterol Cal: 24 mg/dL (ref 5–40)

## 2021-04-12 LAB — VITAMIN D 25 HYDROXY (VIT D DEFICIENCY, FRACTURES): Vit D, 25-Hydroxy: 69.8 ng/mL (ref 30.0–100.0)

## 2021-04-12 LAB — HEMOGLOBIN A1C
Est. average glucose Bld gHb Est-mCnc: 111 mg/dL
Hgb A1c MFr Bld: 5.5 % (ref 4.8–5.6)

## 2021-04-12 LAB — INSULIN, RANDOM: INSULIN: 10.2 u[IU]/mL (ref 2.6–24.9)

## 2021-04-17 ENCOUNTER — Other Ambulatory Visit (HOSPITAL_COMMUNITY): Payer: Self-pay

## 2021-04-17 ENCOUNTER — Other Ambulatory Visit (HOSPITAL_BASED_OUTPATIENT_CLINIC_OR_DEPARTMENT_OTHER): Payer: Self-pay

## 2021-04-20 ENCOUNTER — Encounter: Payer: Self-pay | Admitting: Family

## 2021-04-20 ENCOUNTER — Other Ambulatory Visit: Payer: Self-pay | Admitting: Family

## 2021-04-20 ENCOUNTER — Other Ambulatory Visit (HOSPITAL_BASED_OUTPATIENT_CLINIC_OR_DEPARTMENT_OTHER): Payer: Self-pay

## 2021-04-20 MED ORDER — LORAZEPAM 0.5 MG PO TABS
ORAL_TABLET | ORAL | 1 refills | Status: DC
  Filled 2021-04-20: qty 30, 30d supply, fill #0
  Filled 2021-08-22: qty 30, 30d supply, fill #1

## 2021-05-02 ENCOUNTER — Telehealth (INDEPENDENT_AMBULATORY_CARE_PROVIDER_SITE_OTHER): Payer: 59 | Admitting: Physician Assistant

## 2021-05-02 DIAGNOSIS — I10 Essential (primary) hypertension: Secondary | ICD-10-CM

## 2021-05-02 DIAGNOSIS — Z6835 Body mass index (BMI) 35.0-35.9, adult: Secondary | ICD-10-CM | POA: Diagnosis not present

## 2021-05-10 NOTE — Progress Notes (Signed)
TeleHealth Visit:  Due to the COVID-19 pandemic, this visit was completed with telemedicine (audio/video) technology to reduce patient and provider exposure as well as to preserve personal protective equipment.   Jessica Griffin has verbally consented to this TeleHealth visit. The patient is located at home, the provider is located at the [facility] office. The participants in this visit include the listed provider and patient. The visit was conducted today via MyChart video.   Chief Complaint: OBESITY Jessica Griffin is here to discuss her progress with her obesity treatment plan along with follow-up of her obesity related diagnoses. Jessica Griffin is on the Category 2 Plan and states she is following her eating plan approximately 75% of the time. Jessica Griffin states she is doing 0 minutes 0 times per week.  Today's visit was #: 8 Starting weight: 226 lbs Starting date: 12/20/2020  Interim History: Jessica Griffin reports that she was skipping meals over the last few weeks due to a busy work schedule. She is back on her regular work schedule and she is working from home. She is on Saxenda 1.2 mg.  Subjective:   1. Essential hypertension Jessica Griffin is on Hyzaar, and her blood pressure has been controlled at each of her last 5 visits. She denies headache or chest pain.  Assessment/Plan:   1. Essential hypertension Jessica Griffin will continue her medications and weight loss to improve blood pressure control. We will watch for signs of hypotension as she continues her lifestyle modifications.  2. Class 2 severe obesity with serious comorbidity and body mass index (BMI) of 35.0 to 35.9 in adult, unspecified obesity type [facility]) Jessica Griffin is currently in the action stage of change. As such, her goal is to continue with weight loss efforts. She has agreed to the Category 2 Plan.   Exercise goals: No exercise has been prescribed at this time.  Behavioral modification strategies: meal planning and cooking strategies.  Jessica Griffin  has agreed to follow-up with our clinic in 4 weeks. She was informed of the importance of frequent follow-up visits to maximize her success with intensive lifestyle modifications for her multiple health conditions.  Objective:   VITALS: Per patient if applicable, see vitals. GENERAL: Alert and in no acute distress. CARDIOPULMONARY: No increased WOB. Speaking in clear sentences.  PSYCH: Pleasant and cooperative. Speech normal rate and rhythm. Affect is appropriate. Insight and judgement are appropriate. Attention is focused, linear, and appropriate.  NEURO: Oriented as arrived to appointment on time with no prompting.   Lab Results  Component Value Date   CREATININE 0.77 04/11/2021   BUN 12 04/11/2021   NA 138 04/11/2021   K 4.0 04/11/2021   CL 100 04/11/2021   CO2 24 04/11/2021   Lab Results  Component Value Date   ALT 21 04/11/2021   AST 17 04/11/2021   ALKPHOS 78 04/11/2021   BILITOT 0.5 04/11/2021   Lab Results  Component Value Date   HGBA1C 5.5 04/11/2021   HGBA1C 5.4 12/20/2020   Lab Results  Component Value Date   INSULIN 10.2 04/11/2021   INSULIN 18.4 12/20/2020   Lab Results  Component Value Date   TSH 2.070 12/20/2020   Lab Results  Component Value Date   CHOL 191 04/11/2021   HDL 39 (L) 04/11/2021   LDLCALC 128 (H) 04/11/2021   TRIG 135 04/11/2021   CHOLHDL 4.9 (H) 04/11/2021   Lab Results  Component Value Date   WBC 9.4 12/20/2020   HGB 12.9 12/20/2020   HCT 42.1 12/20/2020   MCV  81 12/20/2020   PLT 334 12/20/2020   Lab Results  Component Value Date   IRON 61 05/25/2020   TIBC 333 05/25/2020   FERRITIN 55 05/25/2020    Attestation Statements:   Reviewed by clinician on day of visit: allergies, medications, problem list, medical history, surgical history, family history, social history, and previous encounter notes.   Wilhemena Durie, am acting as transcriptionist for [employer], PA-C.  I have reviewed the above documentation for  accuracy and completeness, and I agree with the above. Abby Potash, PA-C

## 2021-05-16 ENCOUNTER — Other Ambulatory Visit (HOSPITAL_BASED_OUTPATIENT_CLINIC_OR_DEPARTMENT_OTHER): Payer: Self-pay

## 2021-05-16 MED FILL — Potassium Chloride Microencapsulated Crys ER Tab 20 mEq: ORAL | 90 days supply | Qty: 90 | Fill #0 | Status: AC

## 2021-05-16 MED FILL — Losartan Potassium & Hydrochlorothiazide Tab 100-25 MG: ORAL | 90 days supply | Qty: 90 | Fill #0 | Status: AC

## 2021-05-30 ENCOUNTER — Ambulatory Visit (INDEPENDENT_AMBULATORY_CARE_PROVIDER_SITE_OTHER): Payer: 59 | Admitting: Physician Assistant

## 2021-05-31 ENCOUNTER — Telehealth (INDEPENDENT_AMBULATORY_CARE_PROVIDER_SITE_OTHER): Payer: Self-pay

## 2021-05-31 NOTE — Telephone Encounter (Signed)
Jessica Griffin has been approved 06/02/21 until 06/01/2022 Pt has been notified via my chart

## 2021-06-13 ENCOUNTER — Other Ambulatory Visit (HOSPITAL_BASED_OUTPATIENT_CLINIC_OR_DEPARTMENT_OTHER): Payer: Self-pay

## 2021-06-13 ENCOUNTER — Other Ambulatory Visit (INDEPENDENT_AMBULATORY_CARE_PROVIDER_SITE_OTHER): Payer: Self-pay | Admitting: Family Medicine

## 2021-06-13 DIAGNOSIS — H5213 Myopia, bilateral: Secondary | ICD-10-CM | POA: Diagnosis not present

## 2021-06-13 DIAGNOSIS — H524 Presbyopia: Secondary | ICD-10-CM | POA: Diagnosis not present

## 2021-06-13 NOTE — Telephone Encounter (Signed)
Last seen Jessica Griffin 

## 2021-06-14 ENCOUNTER — Other Ambulatory Visit (HOSPITAL_BASED_OUTPATIENT_CLINIC_OR_DEPARTMENT_OTHER): Payer: Self-pay

## 2021-06-14 ENCOUNTER — Other Ambulatory Visit (INDEPENDENT_AMBULATORY_CARE_PROVIDER_SITE_OTHER): Payer: Self-pay | Admitting: Family Medicine

## 2021-06-14 NOTE — Telephone Encounter (Signed)
Pt needs an appt

## 2021-06-16 ENCOUNTER — Other Ambulatory Visit (HOSPITAL_BASED_OUTPATIENT_CLINIC_OR_DEPARTMENT_OTHER): Payer: Self-pay

## 2021-06-19 ENCOUNTER — Other Ambulatory Visit (HOSPITAL_BASED_OUTPATIENT_CLINIC_OR_DEPARTMENT_OTHER): Payer: Self-pay

## 2021-06-20 ENCOUNTER — Other Ambulatory Visit (HOSPITAL_BASED_OUTPATIENT_CLINIC_OR_DEPARTMENT_OTHER): Payer: Self-pay

## 2021-06-26 ENCOUNTER — Other Ambulatory Visit (HOSPITAL_BASED_OUTPATIENT_CLINIC_OR_DEPARTMENT_OTHER): Payer: Self-pay

## 2021-06-29 ENCOUNTER — Encounter: Payer: Self-pay | Admitting: Family

## 2021-06-29 ENCOUNTER — Other Ambulatory Visit (HOSPITAL_BASED_OUTPATIENT_CLINIC_OR_DEPARTMENT_OTHER): Payer: Self-pay

## 2021-06-29 ENCOUNTER — Ambulatory Visit (INDEPENDENT_AMBULATORY_CARE_PROVIDER_SITE_OTHER): Payer: 59 | Admitting: Family

## 2021-06-29 ENCOUNTER — Other Ambulatory Visit (INDEPENDENT_AMBULATORY_CARE_PROVIDER_SITE_OTHER): Payer: Self-pay | Admitting: Family Medicine

## 2021-06-29 ENCOUNTER — Other Ambulatory Visit: Payer: Self-pay

## 2021-06-29 VITALS — BP 132/80 | HR 98 | Temp 97.9°F | Ht 64.0 in | Wt 219.4 lb

## 2021-06-29 DIAGNOSIS — Z23 Encounter for immunization: Secondary | ICD-10-CM

## 2021-06-29 DIAGNOSIS — Z0000 Encounter for general adult medical examination without abnormal findings: Secondary | ICD-10-CM

## 2021-06-29 DIAGNOSIS — Z6837 Body mass index (BMI) 37.0-37.9, adult: Secondary | ICD-10-CM

## 2021-06-29 MED ORDER — LOSARTAN POTASSIUM-HCTZ 100-25 MG PO TABS
1.0000 | ORAL_TABLET | Freq: Every day | ORAL | 3 refills | Status: DC
  Filled 2021-06-29 – 2021-08-22 (×2): qty 90, 90d supply, fill #0
  Filled 2021-11-22: qty 90, 90d supply, fill #1
  Filled 2022-03-14: qty 90, 90d supply, fill #2
  Filled 2022-06-18: qty 90, 90d supply, fill #3

## 2021-06-29 MED ORDER — POTASSIUM CHLORIDE CRYS ER 20 MEQ PO TBCR
EXTENDED_RELEASE_TABLET | Freq: Every day | ORAL | 3 refills | Status: DC
  Filled 2021-06-29 – 2021-08-22 (×2): qty 90, 90d supply, fill #0
  Filled 2021-11-22: qty 90, 90d supply, fill #1
  Filled 2022-03-14: qty 90, 90d supply, fill #2
  Filled 2022-06-18: qty 90, 90d supply, fill #3

## 2021-06-29 NOTE — Progress Notes (Signed)
Jessica Griffin is a 45 y.o. female with the following history as recorded in EpicCare:  Patient Active Problem List   Diagnosis Date Noted  . Postoperative state 12/02/2019  . Anxiety and depression 09/16/2017  . Essential hypertension 03/06/2017    Current Outpatient Medications  Medication Sig Dispense Refill  . aspirin EC 81 MG tablet Take 81 mg by mouth daily.     . Biotin 5 MG TABS Take 2 tablets by mouth daily.    . cetirizine (ZYRTEC) 10 MG chewable tablet Chew 10 mg by mouth daily.    . Cholecalciferol (VITAMIN D3) 125 MCG (5000 UT) CAPS Take 1 capsule by mouth daily.    . hydrocortisone-pramoxine (PROCTOFOAM HC) rectal foam Place 1 applicator rectally 3 (three) times daily. 10 g 0  . Insulin Pen Needle 32G X 4 MM MISC USE ONE PEN NEEDLE ONCE DAILY TO INJECT SAXENDA 100 each 0  . Liraglutide -Weight Management 18 MG/3ML SOPN INJECT 3 MG INTO THE SKIN DAILY. 15 mL 0  . loratadine (CLARITIN) 10 MG tablet Take 10 mg by mouth daily as needed for allergies.     Marland Kitchen LORazepam (ATIVAN) 0.5 MG tablet TAKE 1 TABLET BY MOUTH ONCE DAILY AS NEEDED FOR ANXIETY 30 tablet 1  . melatonin 5 MG TABS Take 5 mg by mouth at bedtime as needed.    . Multiple Vitamin (MULTIVITAMIN) capsule Take 1 capsule by mouth daily.     . Omega-3 1000 MG CAPS Take 1,000 mg by mouth daily.     . Probiotic Product ([employer] ADV PROBIOTIC GUMMIES) CHEW Chew 2 each by mouth daily.    . vitamin B-12 (CYANOCOBALAMIN) 1000 MCG tablet Take by mouth.    . vitamin C (ASCORBIC ACID) 500 MG tablet Take by mouth.    . losartan-hydrochlorothiazide (HYZAAR) 100-25 MG tablet TAKE 1 TABLET BY MOUTH ONCE DAILY 90 tablet 3  . potassium chloride SA (KLOR-CON) 20 MEQ tablet TAKE 1 TABLET BY MOUTH ONCE DAILY 90 tablet 3   No current facility-administered medications for this visit.    Allergies: Sulfa antibiotics  Past Medical History:  Diagnosis Date  . Anxiety   . Astigmatism   . Bilateral swelling of feet and ankles   .  Constipation   . Depression   . DUB (dysfunctional uterine bleeding)   . Enlarged heart   . Hypertension   . Myopia   . Other fatigue   . Palpitations   . Presbyopia   . Shortness of breath on exertion   . SOB (shortness of breath)   . Uterine fibroid     Past Surgical History:  Procedure Laterality Date  . COLONOSCOPY  08/09/2020  . ROBOTIC ASSISTED TOTAL HYSTERECTOMY WITH BILATERAL SALPINGO OOPHERECTOMY Bilateral 12/02/2019   Procedure: XI ROBOTIC ASSISTED TOTAL HYSTERECTOMY WITH BILATERAL SALPINGECTOMY;  Surgeon: [name], MD;  Location: [place] [facility];  Service: Gynecology;  Laterality: Bilateral;  . TONSILLECTOMY  1996    Family History  Problem Relation Age of Onset  . Arthritis Mother   . Miscarriages / Stillbirths Sister   . Arthritis Maternal Grandmother   . Diabetes Maternal Grandmother   . Diabetes Maternal Grandfather   . Hearing loss Maternal Grandfather   . Heart disease Maternal Grandfather   . High blood pressure Maternal Grandfather   . Stroke Maternal Grandfather   . Colon cancer Neg Hx   . Esophageal cancer Neg Hx   . Stomach cancer Neg Hx   . Rectal cancer Neg Hx  Social History   Tobacco Use  . Smoking status: Never  . Smokeless tobacco: Never  Substance Use Topics  . Alcohol use: Yes    Comment: occasional    Subjective:  Presents for yearly CPE; working with healthy weight and wellness; recent labs done in May; s/p hysterectomy- under care of GYN;   Review of Systems  Constitutional:  Positive for weight loss.       Planned weight loss  HENT: Negative.    Eyes: Negative.   Respiratory: Negative.    Cardiovascular: Negative.   Gastrointestinal: Negative.   Genitourinary: Negative.   Musculoskeletal: Negative.   Skin: Negative.   Neurological: Negative.   Endo/Heme/Allergies: Negative.   Psychiatric/Behavioral: Negative.        Objective:  Vitals:   06/29/21 0818  BP: 132/80  Pulse: 98  Temp: 97.9 F  (36.6 C)  TempSrc: Oral  SpO2: 99%  Weight: 219 lb 6.4 oz (99.5 kg)  Height: '5\' 4"'$  (1.626 m)    General: Well developed, well nourished, in no acute distress  Skin : Warm and dry.  Head: Normocephalic and atraumatic  Eyes: Sclera and conjunctiva clear; pupils round and reactive to light; extraocular movements intact  Ears: External normal; canals clear; tympanic membranes normal  Oropharynx: Pink, supple. No suspicious lesions  Neck: Supple without thyromegaly, adenopathy  Lungs: Respirations unlabored; clear to auscultation bilaterally without wheeze, rales, rhonchi  CVS exam: normal rate and regular rhythm.  Abdomen: Soft; nontender; nondistended; normoactive bowel sounds; no masses or hepatosplenomegaly  Musculoskeletal: No deformities; no active joint inflammation  Extremities: No edema, cyanosis, clubbing  Vessels: Symmetric bilaterally  Neurologic: Alert and oriented; speech intact; face symmetrical; moves all extremities well; CNII-XII intact without focal deficit   Assessment:  1. Well adult exam     Plan:  Age appropriate preventive healthcare needs addressed; encouraged regular eye doctor and dental exams; encouraged regular exercise; will update labs and refills as needed today; follow-up to be determined; Tdap updated today;   This visit occurred during the SARS-CoV-2 public health emergency.  Safety protocols were in place, including screening questions prior to the visit, additional usage of staff PPE, and extensive cleaning of exam room while observing appropriate contact time as indicated for disinfecting solutions.     No follow-ups on file.  No orders of the defined types were placed in this encounter.   Requested Prescriptions   Signed Prescriptions Disp Refills  . losartan-hydrochlorothiazide (HYZAAR) 100-25 MG tablet 90 tablet 3    Sig: TAKE 1 TABLET BY MOUTH ONCE DAILY  . potassium chloride SA (KLOR-CON) 20 MEQ tablet 90 tablet 3    Sig: TAKE 1 TABLET  BY MOUTH ONCE DAILY

## 2021-06-30 ENCOUNTER — Other Ambulatory Visit (HOSPITAL_BASED_OUTPATIENT_CLINIC_OR_DEPARTMENT_OTHER): Payer: Self-pay

## 2021-07-04 ENCOUNTER — Encounter (INDEPENDENT_AMBULATORY_CARE_PROVIDER_SITE_OTHER): Payer: Self-pay

## 2021-07-05 ENCOUNTER — Other Ambulatory Visit (INDEPENDENT_AMBULATORY_CARE_PROVIDER_SITE_OTHER): Payer: Self-pay | Admitting: Family Medicine

## 2021-07-05 ENCOUNTER — Encounter (INDEPENDENT_AMBULATORY_CARE_PROVIDER_SITE_OTHER): Payer: Self-pay | Admitting: Emergency Medicine

## 2021-07-05 ENCOUNTER — Other Ambulatory Visit (HOSPITAL_BASED_OUTPATIENT_CLINIC_OR_DEPARTMENT_OTHER): Payer: Self-pay

## 2021-07-05 ENCOUNTER — Other Ambulatory Visit (INDEPENDENT_AMBULATORY_CARE_PROVIDER_SITE_OTHER): Payer: Self-pay | Admitting: Physician Assistant

## 2021-07-05 NOTE — Telephone Encounter (Signed)
My chart msg sent to patient. 

## 2021-07-06 ENCOUNTER — Other Ambulatory Visit (HOSPITAL_BASED_OUTPATIENT_CLINIC_OR_DEPARTMENT_OTHER): Payer: Self-pay

## 2021-07-11 ENCOUNTER — Other Ambulatory Visit (HOSPITAL_BASED_OUTPATIENT_CLINIC_OR_DEPARTMENT_OTHER): Payer: Self-pay

## 2021-07-11 ENCOUNTER — Ambulatory Visit (INDEPENDENT_AMBULATORY_CARE_PROVIDER_SITE_OTHER): Payer: 59 | Admitting: Family Medicine

## 2021-07-11 ENCOUNTER — Encounter (INDEPENDENT_AMBULATORY_CARE_PROVIDER_SITE_OTHER): Payer: Self-pay | Admitting: Family Medicine

## 2021-07-11 ENCOUNTER — Other Ambulatory Visit: Payer: Self-pay

## 2021-07-11 VITALS — BP 122/87 | HR 95 | Temp 98.2°F | Ht 65.0 in | Wt 215.0 lb

## 2021-07-11 DIAGNOSIS — Z6837 Body mass index (BMI) 37.0-37.9, adult: Secondary | ICD-10-CM | POA: Diagnosis not present

## 2021-07-11 DIAGNOSIS — I10 Essential (primary) hypertension: Secondary | ICD-10-CM | POA: Diagnosis not present

## 2021-07-11 DIAGNOSIS — Z9189 Other specified personal risk factors, not elsewhere classified: Secondary | ICD-10-CM | POA: Diagnosis not present

## 2021-07-11 MED ORDER — LIRAGLUTIDE -WEIGHT MANAGEMENT 18 MG/3ML ~~LOC~~ SOPN
3.0000 mg | PEN_INJECTOR | Freq: Every day | SUBCUTANEOUS | 0 refills | Status: DC
  Filled 2021-07-11: qty 15, 30d supply, fill #0

## 2021-07-11 MED ORDER — INSULIN PEN NEEDLE 32G X 4 MM MISC
0 refills | Status: DC
  Filled 2021-07-11: qty 100, 90d supply, fill #0

## 2021-07-12 ENCOUNTER — Other Ambulatory Visit (HOSPITAL_BASED_OUTPATIENT_CLINIC_OR_DEPARTMENT_OTHER): Payer: Self-pay

## 2021-07-12 ENCOUNTER — Ambulatory Visit (INDEPENDENT_AMBULATORY_CARE_PROVIDER_SITE_OTHER): Payer: 59 | Admitting: Physician Assistant

## 2021-07-12 NOTE — Progress Notes (Signed)
Chief Complaint:   OBESITY Jessica Griffin is here to discuss her progress with her obesity treatment plan along with follow-up of her obesity related diagnoses. Jessica Griffin is on the Category 2 Plan and states she is following her eating plan approximately 50% of the time. Jessica Griffin states she is doing yoga for 30 minutes 3 times per week.  Today's visit was #: 9 Starting weight: 226 lbs Starting date: 12/20/2020 Today's weight: 215 lbs Today's date: 07/11/2021 Total lbs lost to date: 11 Total lbs lost since last in-office visit: 0  Interim History: Jessica Griffin is still skipping meals and food, especially breakfast and dinner. Last office visit was 04/11/2021 with Jessica Griffin. This is her first office visit with me. She has been off of [place] for 2 weeks. She has no issues with the plan, she is just not eating those foods.  Subjective:   1. Essential hypertension Donza Oltman is tolerating medication(s) well without side effects. She is taking Hyzaar currently. Medication compliance is good and patient appears to be taking it as prescribed.  Denies additional concerns regarding this condition.   2. At risk for side effect of medication [place] is at risk for drug side effects due to restarting Saxenda.  Assessment/Plan:  No orders of the defined types were placed in this encounter.   Medications Discontinued During This Encounter  Medication Reason   Liraglutide -Weight Management 18 MG/3ML SOPN Reorder   Insulin Pen Needle 32G X 4 MM MISC Reorder     Meds ordered this encounter  Medications   Liraglutide -Weight Management 18 MG/3ML SOPN    Sig: INJECT 3 MG INTO THE SKIN DAILY.    Dispense:  15 mL    Refill:  0   Insulin Pen Needle 32G X 4 MM MISC    Sig: USE ONE PEN NEEDLE ONCE DAILY TO INJECT SAXENDA    Dispense:  100 each    Refill:  0     1. Essential hypertension Jessica Griffin's blood pressure is at goal. She will continue her medications, and will continue working on healthy weight  loss and exercise to improve blood pressure control. We will watch for signs of hypotension as she continues her lifestyle modifications.  2. At risk for side effect of medication Jessica Griffin was given approximately 9 minutes of drug side effect counseling today.  We discussed side effect possibility and risk versus benefits. Jessica Griffin agreed to the medication and will contact this office if these side effects are intolerable.  Repetitive spaced learning was employed today to elicit superior memory formation and behavioral change.  3. Obesity with current BMI of 35.8 Jessica Griffin is currently in the action stage of change. As such, her goal is to continue with weight loss efforts. She has agreed to the Category 2 Plan.   Jessica Griffin is to stick with eating on the plan. She is to weigh her protein, minimum of 100 oz per day.  We discussed various medication options to help Jessica Griffin with her weight loss efforts and we both agreed to restart Saxenda at 3 mg daily with no refills, and pen needles #100 with no refills.  - Liraglutide -Weight Management 18 MG/3ML SOPN; INJECT 3 MG INTO THE SKIN DAILY.  Dispense: 15 mL; Refill: 0 - Insulin Pen Needle 32G X 4 MM MISC; USE ONE PEN NEEDLE ONCE DAILY TO INJECT SAXENDA  Dispense: 100 each; Refill: 0  Exercise goals: As is.  Behavioral modification strategies: increasing lean protein intake, decreasing simple carbohydrates, and no skipping meals.  Jessica Griffin has agreed to follow-up with our clinic in 3 to 4 weeks. She was informed of the importance of frequent follow-up visits to maximize her success with intensive lifestyle modifications for her multiple health conditions.   Objective:   Blood pressure 122/87, pulse 95, temperature 98.2 F (36.8 C), height '5\' 5"'$  (1.651 m), weight 215 lb (97.5 kg), last menstrual period 11/17/2019, SpO2 97 %. Body mass index is 35.78 kg/m.  General: Cooperative, alert, well developed, in no acute distress. HEENT: Conjunctivae and lids  unremarkable. Cardiovascular: Regular rhythm.  Lungs: Normal work of breathing. Neurologic: No focal deficits.   Lab Results  Component Value Date   CREATININE 0.77 04/11/2021   BUN 12 04/11/2021   NA 138 04/11/2021   K 4.0 04/11/2021   CL 100 04/11/2021   CO2 24 04/11/2021   Lab Results  Component Value Date   ALT 21 04/11/2021   AST 17 04/11/2021   ALKPHOS 78 04/11/2021   BILITOT 0.5 04/11/2021   Lab Results  Component Value Date   HGBA1C 5.5 04/11/2021   HGBA1C 5.4 12/20/2020   Lab Results  Component Value Date   INSULIN 10.2 04/11/2021   INSULIN 18.4 12/20/2020   Lab Results  Component Value Date   TSH 2.070 12/20/2020   Lab Results  Component Value Date   CHOL 191 04/11/2021   HDL 39 (L) 04/11/2021   LDLCALC 128 (H) 04/11/2021   TRIG 135 04/11/2021   CHOLHDL 4.9 (H) 04/11/2021   Lab Results  Component Value Date   VD25OH 69.8 04/11/2021   VD25OH 48.7 12/20/2020   Lab Results  Component Value Date   WBC 9.4 12/20/2020   HGB 12.9 12/20/2020   HCT 42.1 12/20/2020   MCV 81 12/20/2020   PLT 334 12/20/2020   Lab Results  Component Value Date   IRON 61 05/25/2020   TIBC 333 05/25/2020   FERRITIN 55 05/25/2020   Attestation Statements:   Reviewed by clinician on day of visit: allergies, medications, problem list, medical history, surgical history, family history, social history, and previous encounter notes.   Wilhemena Durie, am acting as transcriptionist for [employer], DO.  I have reviewed the above documentation for accuracy and completeness, and I agree with the above. Marjory Sneddon, D.O.  The [place] was signed into law in 2016 which includes the topic of electronic health records.  This provides immediate access to information in MyChart.  This includes consultation notes, operative notes, office notes, lab results and pathology reports.  If you have any questions about what you read please let us know at your next  visit so we can discuss your concerns and take corrective action if need be.  We are right here with you.

## 2021-08-07 ENCOUNTER — Ambulatory Visit (INDEPENDENT_AMBULATORY_CARE_PROVIDER_SITE_OTHER): Payer: 59 | Admitting: Family Medicine

## 2021-08-23 ENCOUNTER — Other Ambulatory Visit (HOSPITAL_BASED_OUTPATIENT_CLINIC_OR_DEPARTMENT_OTHER): Payer: Self-pay

## 2021-09-01 ENCOUNTER — Other Ambulatory Visit: Payer: Self-pay

## 2021-09-01 ENCOUNTER — Encounter: Payer: Self-pay | Admitting: Family

## 2021-09-01 ENCOUNTER — Other Ambulatory Visit (HOSPITAL_COMMUNITY): Payer: Self-pay

## 2021-09-01 ENCOUNTER — Telehealth: Payer: 59 | Admitting: Family

## 2021-09-01 DIAGNOSIS — J0110 Acute frontal sinusitis, unspecified: Secondary | ICD-10-CM | POA: Diagnosis not present

## 2021-09-01 MED ORDER — CROMOLYN SODIUM 4 % OP SOLN: 2.0000 [drp] | Freq: Four times a day (QID) | OPHTHALMIC | Status: DC

## 2021-09-01 MED ORDER — AMOXICILLIN-POT CLAVULANATE 875-125 MG PO TABS
1.0000 | ORAL_TABLET | Freq: Two times a day (BID) | ORAL | 0 refills | Status: AC
  Filled 2021-09-01: qty 14, 7d supply, fill #0

## 2021-09-01 MED ORDER — CROMOLYN SODIUM 4 % OP SOLN
2.0000 [drp] | Freq: Four times a day (QID) | OPHTHALMIC | 0 refills | Status: AC
  Filled 2021-09-01: qty 10, 7d supply, fill #0

## 2021-09-01 NOTE — Addendum Note (Signed)
Addended byJeanie Sewer on: 09/01/2021 11:28 AM   Modules accepted: Orders

## 2021-09-01 NOTE — Progress Notes (Signed)
MyChart Video Visit    Virtual Visit via Video Note   This visit type was conducted due to national recommendations for restrictions regarding the COVID-19 Pandemic (e.g. social distancing) in an effort to limit this patient's exposure and mitigate transmission in our community. This patient is at least at moderate risk for complications without adequate follow up. This format is felt to be most appropriate for this patient at this time. Physical exam was limited by quality of the video and audio technology used for the visit. CMA was able to get the patient set up on a video visit.  Patient location: Home. Patient and provider in visit Provider location: Office  I discussed the limitations of evaluation and management by telemedicine and the availability of in person appointments. The patient expressed understanding and agreed to proceed.  Visit Date: 09/01/2021  Today's healthcare provider: Jeanie Sewer, NP     Subjective:    Patient ID: Jessica Griffin, female    DOB: Jan 15, 1976, 45 y.o.   MRN: [id number]  Chief Complaint  Patient presents with   Nasal Congestion    Starting at least 2 weeks ago or more, it has worsened in the last 2 days. Difficulty breathing.    Ear Pain   Facial Pain    She denies fever.    Sinus pressure    Pt says that her head feels full. She also has tension on the back of her neck.   Sore Throat    Mild    Sore Throat   Reports symptoms for 2 weeks, including sinus pain, ear pain, headache, sore throat, no fever, pt is taking Sudafed, dayquil and nyquil, took abt for ear infection about 2 mos ago.    Past Surgical History:  Procedure Laterality Date   COLONOSCOPY  08/09/2020   ROBOTIC ASSISTED TOTAL HYSTERECTOMY WITH BILATERAL SALPINGO OOPHERECTOMY Bilateral 12/02/2019   Procedure: XI ROBOTIC ASSISTED TOTAL HYSTERECTOMY WITH BILATERAL SALPINGECTOMY;  Surgeon: [name], MD;  Location: [facility];  Service:  Gynecology;  Laterality: Bilateral;   TONSILLECTOMY  1996     Outpatient Medications Prior to Visit  Medication Sig Dispense Refill   aspirin EC 81 MG tablet Take 81 mg by mouth daily.      Biotin 5 MG TABS Take 2 tablets by mouth daily.     cetirizine (ZYRTEC) 10 MG chewable tablet Chew 10 mg by mouth daily.     Cholecalciferol (VITAMIN D3) 125 MCG (5000 UT) CAPS Take 1 capsule by mouth daily.     hydrocortisone-pramoxine (PROCTOFOAM HC) rectal foam Place 1 applicator rectally 3 (three) times daily. 10 g 0   Insulin Pen Needle 32G X 4 MM MISC USE ONE PEN NEEDLE ONCE DAILY TO INJECT SAXENDA 100 each 0   Liraglutide -Weight Management 18 MG/3ML SOPN INJECT 3 MG INTO THE SKIN DAILY. 15 mL 0   loratadine (CLARITIN) 10 MG tablet Take 10 mg by mouth daily as needed for allergies.      LORazepam (ATIVAN) 0.5 MG tablet TAKE 1 TABLET BY MOUTH ONCE DAILY AS NEEDED FOR ANXIETY 30 tablet 1   losartan-hydrochlorothiazide (HYZAAR) 100-25 MG tablet TAKE 1 TABLET BY MOUTH ONCE DAILY 90 tablet 3   melatonin 5 MG TABS Take 5 mg by mouth at bedtime as needed.     Multiple Vitamin (MULTIVITAMIN) capsule Take 1 capsule by mouth daily.      Omega-3 1000 MG CAPS Take 1,000 mg by mouth daily.      potassium  chloride SA (KLOR-CON) 20 MEQ tablet TAKE 1 TABLET BY MOUTH ONCE DAILY 90 tablet 3   Probiotic Product ([employer] ADV PROBIOTIC GUMMIES) CHEW Chew 2 each by mouth daily.     vitamin B-12 (CYANOCOBALAMIN) 1000 MCG tablet Take by mouth.     vitamin C (ASCORBIC ACID) 500 MG tablet Take by mouth.     No facility-administered medications prior to visit.    Allergies  Allergen Reactions   Sulfa Antibiotics Hives    ROS See HPI above.        Objective:    Physical Exam Vitals and nursing note reviewed.  Constitutional:      General: She is not in acute distress.    Appearance: Normal appearance.  HENT:     Head: Normocephalic.     Nose: Congestion (audible nasal congestion noted) present.  Eyes:      Conjunctiva/sclera:     Right eye: Right conjunctiva is injected.     Left eye: Left conjunctiva is injected.     Comments: Periorbital edema noted bilaterally  Pulmonary:     Effort: No respiratory distress.  Musculoskeletal:     Cervical back: Normal range of motion.  Skin:    General: Skin is dry.     Coloration: Skin is not pale.  Neurological:     Mental Status: She is alert and oriented to person, place, and time.  Psychiatric:        Mood and Affect: Mood normal.    Ht 5\' 5"  (1.651 m)   Wt 214 lb 15.2 oz (97.5 kg)   LMP 11/17/2019   BMI 35.77 kg/m  Wt Readings from Last 3 Encounters:  09/01/21 214 lb 15.2 oz (97.5 kg)  07/11/21 215 lb (97.5 kg)  06/29/21 219 lb 6.4 oz (99.5 kg)       Assessment & Plan:   Problem List Items Addressed This Visit       Respiratory   Acute frontal sinusitis    SX for 2 weeks, otitis media 2 mos ago. Sending Augmentin x 7d and antihistamine eye drops.      Relevant Medications   amoxicillin-clavulanate (AUGMENTIN) 875-125 MG tablet   cromolyn (OPTICROM) 4 % ophthalmic solution 2 drop (Start on 09/01/2021 11:30 AM)    I am having Jessica Griffin start on amoxicillin-clavulanate. I am also having her maintain her multivitamin, aspirin EC, vitamin C, vitamin B-12, loratadine, Omega-3, Proctofoam HC, Vitamin D3, cetirizine, Biotin, CVS Adv Probiotic Gummies, melatonin, LORazepam, losartan-hydrochlorothiazide, potassium chloride SA, Liraglutide -Weight Management, and Insulin Pen Needle. We will continue to administer cromolyn.  Meds ordered this encounter  Medications   amoxicillin-clavulanate (AUGMENTIN) 875-125 MG tablet    Sig: Take 1 tablet by mouth 2 (two) times daily for 7 days. AFTER eating.    Dispense:  14 tablet    Refill:  0    Order Specific Question:   Supervising Provider    Answer:   ANDY, CAMILLE L [[id number]]   cromolyn (OPTICROM) 4 % ophthalmic solution 2 drop    I discussed the assessment and treatment plan with  the patient. The patient was provided an opportunity to ask questions and all were answered. The patient agreed with the plan and demonstrated an understanding of the instructions.   The patient was advised to call back or seek an in-person evaluation if the symptoms worsen or if the condition fails to improve as anticipated.  I provided 24 minutes of face-to-face time during this encounter.   Jeanie Sewer, NP  [place] [phone] (phone) [phone] (fax)  [place]

## 2021-09-01 NOTE — Assessment & Plan Note (Addendum)
SX for 2 weeks, otitis media 2 mos ago. Sending Augmentin x 7d and antihistamine eye drops.

## 2021-09-01 NOTE — Patient Instructions (Addendum)
Augmentin (antibiotic) and antihistamine eye drops have been sent to your pharmacy, start taking these today. OK to continue OTC sinus medications. Drink plenty of fluids. Call the office if symptoms are worsening.

## 2021-09-15 ENCOUNTER — Other Ambulatory Visit: Payer: Self-pay | Admitting: Family

## 2021-09-15 ENCOUNTER — Other Ambulatory Visit (HOSPITAL_BASED_OUTPATIENT_CLINIC_OR_DEPARTMENT_OTHER): Payer: Self-pay

## 2021-09-15 ENCOUNTER — Telehealth: Payer: Self-pay | Admitting: Family

## 2021-09-15 MED ORDER — AMOXICILLIN-POT CLAVULANATE 875-125 MG PO TABS
1.0000 | ORAL_TABLET | Freq: Two times a day (BID) | ORAL | 0 refills | Status: AC
  Filled 2021-09-15: qty 20, 10d supply, fill #0

## 2021-09-15 MED ORDER — AZELASTINE HCL 0.05 % OP SOLN
1.0000 [drp] | Freq: Two times a day (BID) | OPHTHALMIC | 0 refills | Status: DC
  Filled 2021-09-15: qty 6, 30d supply, fill #0

## 2021-09-15 NOTE — Telephone Encounter (Signed)
I have called pt back and relayed the message from the provider. I have scheduled pt for follow up appt.

## 2021-09-15 NOTE — Telephone Encounter (Signed)
Pt had a VV with Collet[facility] [place] and pt reports " I feel like I was brushed off and I had to lead my treatment plan since the person was not listening to me".  Pt reports "my eyes are still blood shot red and I was given some over the counter eye drops." She is still having the sinus pressure and still feels foggy. The antibiotics did work, however she was unsure if she needed to take it longer. Pt still reports some sinus pressure.   Please assist. Pt is asking for a rx eyedrop and extended antibiotics if appropriate.

## 2021-09-15 NOTE — Telephone Encounter (Signed)
Pt. States she doesn't seem to be getting 100% better from the antibiotics that was given at her VV at visit with [place]. Pt. States her eyes are back red and still having sinus pressure. Pt wants to know if she needs another round of antibiotics or advice on how to treat symptoms fully.

## 2021-09-26 ENCOUNTER — Other Ambulatory Visit (HOSPITAL_BASED_OUTPATIENT_CLINIC_OR_DEPARTMENT_OTHER): Payer: Self-pay

## 2021-09-26 ENCOUNTER — Ambulatory Visit: Payer: 59 | Admitting: Family

## 2021-09-26 ENCOUNTER — Other Ambulatory Visit: Payer: Self-pay | Admitting: Family

## 2021-09-26 ENCOUNTER — Other Ambulatory Visit: Payer: Self-pay

## 2021-09-26 ENCOUNTER — Encounter: Payer: Self-pay | Admitting: Family

## 2021-09-26 VITALS — BP 120/72 | HR 86 | Temp 98.3°F | Ht 64.0 in | Wt 221.0 lb

## 2021-09-26 DIAGNOSIS — J0110 Acute frontal sinusitis, unspecified: Secondary | ICD-10-CM | POA: Diagnosis not present

## 2021-09-26 MED ORDER — AMOXICILLIN-POT CLAVULANATE 875-125 MG PO TABS
1.0000 | ORAL_TABLET | Freq: Two times a day (BID) | ORAL | 0 refills | Status: AC
  Filled 2021-09-26: qty 20, 10d supply, fill #0

## 2021-09-26 MED ORDER — PREDNISONE 20 MG PO TABS
20.0000 mg | ORAL_TABLET | Freq: Every day | ORAL | 0 refills | Status: DC
  Filled 2021-09-26: qty 5, 5d supply, fill #0

## 2021-09-26 NOTE — Progress Notes (Signed)
Jessica Griffin is a 45 y.o. female with the following history as recorded in EpicCare:  Patient Active Problem List   Diagnosis Date Noted   Acute frontal sinusitis 09/01/2021   Postoperative state 12/02/2019   Anxiety and depression 09/16/2017   Essential hypertension 03/06/2017    Current Outpatient Medications  Medication Sig Dispense Refill   amoxicillin-clavulanate (AUGMENTIN) 875-125 MG tablet Take 1 tablet by mouth 2 (two) times daily for 10 days. 20 tablet 0   aspirin EC 81 MG tablet Take 81 mg by mouth daily.      azelastine (OPTIVAR) 0.05 % ophthalmic solution Place 1 drop into both eyes 2 (two) times daily. 6 mL 0   Biotin 5 MG TABS Take 2 tablets by mouth daily.     cetirizine (ZYRTEC) 10 MG chewable tablet Chew 10 mg by mouth daily.     Cholecalciferol (VITAMIN D3) 125 MCG (5000 UT) CAPS Take 1 capsule by mouth daily.     loratadine (CLARITIN) 10 MG tablet Take 10 mg by mouth daily as needed for allergies.      LORazepam (ATIVAN) 0.5 MG tablet TAKE 1 TABLET BY MOUTH ONCE DAILY AS NEEDED FOR ANXIETY 30 tablet 1   losartan-hydrochlorothiazide (HYZAAR) 100-25 MG tablet TAKE 1 TABLET BY MOUTH ONCE DAILY 90 tablet 3   Multiple Vitamin (MULTIVITAMIN) capsule Take 1 capsule by mouth daily.      Omega-3 1000 MG CAPS Take 1,000 mg by mouth daily.      potassium chloride SA (KLOR-CON) 20 MEQ tablet TAKE 1 TABLET BY MOUTH ONCE DAILY 90 tablet 3   predniSONE (DELTASONE) 20 MG tablet Take 1 tablet (20 mg total) by mouth daily with breakfast. 5 tablet 0   Probiotic Product ([employer]S ADV PROBIOTIC GUMMIES) CHEW Chew 2 each by mouth daily.     vitamin B-12 (CYANOCOBALAMIN) 1000 MCG tablet Take by mouth.     vitamin C (ASCORBIC ACID) 500 MG tablet Take by mouth.     Insulin Pen Needle 32G X 4 MM MISC USE ONE PEN NEEDLE ONCE DAILY TO INJECT SAXENDA (Patient not taking: Reported on 09/26/2021) 100 each 0   Liraglutide -Weight Management 18 MG/3ML SOPN INJECT 3 MG INTO THE SKIN DAILY. (Patient  not taking: Reported on 09/26/2021) 15 mL 0   melatonin 5 MG TABS Take 5 mg by mouth at bedtime as needed. (Patient not taking: Reported on 09/26/2021)     No current facility-administered medications for this visit.    Allergies: Sulfa antibiotics  Past Medical History:  Diagnosis Date   Anxiety    Astigmatism    Bilateral swelling of feet and ankles    Constipation    Depression    DUB (dysfunctional uterine bleeding)    Enlarged heart    Hypertension    Myopia    Other fatigue    Palpitations    Presbyopia    Shortness of breath on exertion    SOB (shortness of breath)    Uterine fibroid     Past Surgical History:  Procedure Laterality Date   COLONOSCOPY  08/09/2020   ROBOTIC ASSISTED TOTAL HYSTERECTOMY WITH BILATERAL SALPINGO OOPHERECTOMY Bilateral 12/02/2019   Procedure: XI ROBOTIC ASSISTED TOTAL HYSTERECTOMY WITH BILATERAL SALPINGECTOMY;  Surgeon: [name], MD;  Location: [facility];  Service: Gynecology;  Laterality: Bilateral;   TONSILLECTOMY  1996    Family History  Problem Relation Age of Onset   Arthritis Mother    Miscarriages / [place] Sister    Arthritis Maternal  Grandmother    Diabetes Maternal Grandmother    Diabetes Maternal Grandfather    Hearing loss Maternal Grandfather    Heart disease Maternal Grandfather    High blood pressure Maternal Grandfather    Stroke Maternal Grandfather    Colon cancer Neg Hx    Esophageal cancer Neg Hx    Stomach cancer Neg Hx    Rectal cancer Neg Hx     Social History   Tobacco Use   Smoking status: Never   Smokeless tobacco: Never  Substance Use Topics   Alcohol use: Yes    Comment: occasional    Subjective:   Follow up on acute sinusitis; was originally treated with 7 days of Augmentin from 10/7-10/14; called a week later feeling symptoms still present and infection was not treated completely; was re-treated with 7 more days of Augmentin and given Optivar eye drops; feeling much  better today- concerned that could still have residual infection; still congested and mild pain in right ear; no wheezing, fever or shortness of breath;     Objective:  Vitals:   09/26/21 0942  BP: 120/72  Pulse: 86  Temp: 98.3 F (36.8 C)  TempSrc: Oral  SpO2: 98%  Weight: 221 lb (100.2 kg)  Height: 5\' 4"  (1.626 m)    General: Well developed, well nourished, in no acute distress  Skin : Warm and dry.  Head: Normocephalic and atraumatic  Eyes: Sclera and conjunctiva clear; pupils round and reactive to light; extraocular movements intact  Ears: External normal; canals clear; right tympanic membranes erythema Oropharynx: Pink, supple. No suspicious lesions  Neck: Supple without thyromegaly, adenopathy  Lungs: Respirations unlabored; clear to auscultation bilaterally without wheeze, rales, rhonchi  CVS exam: normal rate and regular rhythm.  Neurologic: Alert and oriented; speech intact; face symmetrical; moves all extremities well; CNII-XII intact without focal deficit   Assessment:  1. Acute frontal sinusitis, recurrence not specified     Plan:  Extend Augmentin 875 mg bid x 10 days and add Prednisone 20 qd x 5 days;   This visit occurred during the SARS-CoV-2 public health emergency.  Safety protocols were in place, including screening questions prior to the visit, additional usage of staff PPE, and extensive cleaning of exam room while observing appropriate contact time as indicated for disinfecting solutions.    No follow-ups on file.  No orders of the defined types were placed in this encounter.   Requested Prescriptions   Signed Prescriptions Disp Refills   amoxicillin-clavulanate (AUGMENTIN) 875-125 MG tablet 20 tablet 0    Sig: Take 1 tablet by mouth 2 (two) times daily for 10 days.   predniSONE (DELTASONE) 20 MG tablet 5 tablet 0    Sig: Take 1 tablet (20 mg total) by mouth daily with breakfast.

## 2021-11-22 ENCOUNTER — Other Ambulatory Visit (HOSPITAL_BASED_OUTPATIENT_CLINIC_OR_DEPARTMENT_OTHER): Payer: Self-pay

## 2021-12-20 ENCOUNTER — Encounter: Payer: Self-pay | Admitting: Family

## 2022-01-02 ENCOUNTER — Telehealth: Payer: Self-pay

## 2022-01-02 ENCOUNTER — Other Ambulatory Visit (HOSPITAL_BASED_OUTPATIENT_CLINIC_OR_DEPARTMENT_OTHER): Payer: Self-pay

## 2022-01-02 ENCOUNTER — Ambulatory Visit: Payer: 59 | Admitting: Family

## 2022-01-02 VITALS — BP 124/72 | HR 90 | Temp 97.8°F | Resp 18 | Ht 64.5 in | Wt 222.0 lb

## 2022-01-02 DIAGNOSIS — Z1231 Encounter for screening mammogram for malignant neoplasm of breast: Secondary | ICD-10-CM | POA: Diagnosis not present

## 2022-01-02 DIAGNOSIS — J309 Allergic rhinitis, unspecified: Secondary | ICD-10-CM

## 2022-01-02 DIAGNOSIS — Z6837 Body mass index (BMI) 37.0-37.9, adult: Secondary | ICD-10-CM | POA: Diagnosis not present

## 2022-01-02 LAB — CBC WITH DIFFERENTIAL/PLATELET
Basophils Absolute: 0.1 10*3/uL (ref 0.0–0.1)
Basophils Relative: 0.8 % (ref 0.0–3.0)
Eosinophils Absolute: 0.3 10*3/uL (ref 0.0–0.7)
Eosinophils Relative: 3.8 % (ref 0.0–5.0)
HCT: 39.2 % (ref 36.0–46.0)
Hemoglobin: 12.6 g/dL (ref 12.0–15.0)
Lymphocytes Relative: 27.1 % (ref 12.0–46.0)
Lymphs Abs: 1.8 10*3/uL (ref 0.7–4.0)
MCHC: 32.2 g/dL (ref 30.0–36.0)
MCV: 77.6 fl — ABNORMAL LOW (ref 78.0–100.0)
Monocytes Absolute: 0.5 10*3/uL (ref 0.1–1.0)
Monocytes Relative: 8 % (ref 3.0–12.0)
Neutro Abs: 4 10*3/uL (ref 1.4–7.7)
Neutrophils Relative %: 60.3 % (ref 43.0–77.0)
Platelets: 281 10*3/uL (ref 150.0–400.0)
RBC: 5.05 Mil/uL (ref 3.87–5.11)
RDW: 14.3 % (ref 11.5–15.5)
WBC: 6.7 10*3/uL (ref 4.0–10.5)

## 2022-01-02 LAB — COMPREHENSIVE METABOLIC PANEL
ALT: 23 U/L (ref 0–35)
AST: 21 U/L (ref 0–37)
Albumin: 4.3 g/dL (ref 3.5–5.2)
Alkaline Phosphatase: 57 U/L (ref 39–117)
BUN: 9 mg/dL (ref 6–23)
CO2: 30 mEq/L (ref 19–32)
Calcium: 9.5 mg/dL (ref 8.4–10.5)
Chloride: 101 mEq/L (ref 96–112)
Creatinine, Ser: 0.65 mg/dL (ref 0.40–1.20)
GFR: 104.65 mL/min (ref >60.00)
Glucose, Bld: 100 mg/dL — ABNORMAL HIGH (ref 70–99)
Potassium: 3.9 mEq/L (ref 3.5–5.1)
Sodium: 138 mEq/L (ref 135–145)
Total Bilirubin: 0.5 mg/dL (ref 0.2–1.2)
Total Protein: 6.8 g/dL (ref 6.0–8.3)

## 2022-01-02 LAB — HEMOGLOBIN A1C: Hgb A1c MFr Bld: 5.8 % (ref 4.6–6.5)

## 2022-01-02 MED ORDER — WEGOVY 0.25 MG/0.5ML ~~LOC~~ SOAJ
0.2500 mg | SUBCUTANEOUS | 0 refills | Status: DC
  Filled 2022-01-02: qty 2, 28d supply, fill #0

## 2022-01-02 MED ORDER — FLUTICASONE PROPIONATE 50 MCG/ACT NA SUSP
2.0000 | Freq: Every day | NASAL | 6 refills | Status: DC
  Filled 2022-01-02: qty 16, 30d supply, fill #0
  Filled 2022-10-12: qty 16, 30d supply, fill #1
  Filled 2022-11-23: qty 16, 30d supply, fill #2

## 2022-01-02 NOTE — Telephone Encounter (Signed)
PA initiated via Covermymeds; KEY: [id number]. Awaiting determination.

## 2022-01-02 NOTE — Progress Notes (Signed)
Jessica Griffin is a 46 y.o. female with the following history as recorded in EpicCare:  Patient Active Problem List   Diagnosis Date Noted   Acute frontal sinusitis 09/01/2021   Postoperative state 12/02/2019   Anxiety and depression 09/16/2017   Essential hypertension 03/06/2017    Current Outpatient Medications  Medication Sig Dispense Refill   aspirin EC 81 MG tablet Take 81 mg by mouth daily.      Biotin 5 MG TABS Take 2 tablets by mouth daily.     cetirizine (ZYRTEC) 10 MG chewable tablet Chew 10 mg by mouth daily.     Cholecalciferol (VITAMIN D3) 125 MCG (5000 UT) CAPS Take 1 capsule by mouth daily.     fluticasone (FLONASE) 50 MCG/ACT nasal spray Place 2 sprays into both nostrils daily. 16 g 6   loratadine (CLARITIN) 10 MG tablet Take 10 mg by mouth daily as needed for allergies.      LORazepam (ATIVAN) 0.5 MG tablet TAKE 1 TABLET BY MOUTH ONCE DAILY AS NEEDED FOR ANXIETY 30 tablet 1   losartan-hydrochlorothiazide (HYZAAR) 100-25 MG tablet TAKE 1 TABLET BY MOUTH ONCE DAILY 90 tablet 3   Multiple Vitamin (MULTIVITAMIN) capsule Take 1 capsule by mouth daily.      Omega-3 1000 MG CAPS Take 1,000 mg by mouth daily.      potassium chloride SA (KLOR-CON M) 20 MEQ tablet TAKE 1 TABLET BY MOUTH ONCE DAILY 90 tablet 3   Probiotic Product (CVS ADV PROBIOTIC GUMMIES) CHEW Chew 2 each by mouth daily.     Semaglutide-Weight Management (WEGOVY) 0.25 MG/0.5ML SOAJ Inject 0.25 mg into the skin once a week. 2 mL 0   vitamin B-12 (CYANOCOBALAMIN) 1000 MCG tablet Take by mouth.     vitamin C (ASCORBIC ACID) 500 MG tablet Take by mouth.     azelastine (OPTIVAR) 0.05 % ophthalmic solution Place 1 drop into both eyes 2 (two) times daily. (Patient not taking: Reported on 01/02/2022) 6 mL 0   No current facility-administered medications for this visit.    Allergies: Sulfa antibiotics  Past Medical History:  Diagnosis Date   Anxiety    Astigmatism    Bilateral swelling of feet and ankles     Constipation    Depression    DUB (dysfunctional uterine bleeding)    Enlarged heart    Hypertension    Myopia    Other fatigue    Palpitations    Presbyopia    Shortness of breath on exertion    SOB (shortness of breath)    Uterine fibroid     Past Surgical History:  Procedure Laterality Date   COLONOSCOPY  08/09/2020   ROBOTIC ASSISTED TOTAL HYSTERECTOMY WITH BILATERAL SALPINGO OOPHERECTOMY Bilateral 12/02/2019   Procedure: XI ROBOTIC ASSISTED TOTAL HYSTERECTOMY WITH BILATERAL SALPINGECTOMY;  Surgeon: [name], MD;  Location: [place];  Service: Gynecology;  Laterality: Bilateral;   TONSILLECTOMY  1996    Family History  Problem Relation Age of Onset   Arthritis Mother    Miscarriages / [place] Sister    Arthritis Maternal Grandmother    Diabetes Maternal Grandmother    Diabetes Maternal Grandfather    Hearing loss Maternal Grandfather    Heart disease Maternal Grandfather    High blood pressure Maternal Grandfather    Stroke Maternal Grandfather    Colon cancer Neg Hx    Esophageal cancer Neg Hx    Stomach cancer Neg Hx    Rectal cancer Neg Hx  Social History   Tobacco Use   Smoking status: Never   Smokeless tobacco: Never  Substance Use Topics   Alcohol use: Yes    Comment: occasional    Subjective:  Presents to discuss MCNOBS; has used Saxenda in the past but would prefer weekly injection; feels that she knows how to eat but needs medication to "jump start."  Also concerned that feeling more congested recently; wants to make sure she does not have developing sinus infection.     Objective:  Vitals:   01/02/22 0932  BP: 124/72  Pulse: 90  Resp: 18  Temp: 97.8 F (36.6 C)  TempSrc: Oral  SpO2: 98%  Weight: 222 lb (100.7 kg)  Height: 5' 4.5" (1.638 m)    General: Well developed, well nourished, in no acute distress  Skin : Warm and dry.  Head: Normocephalic and atraumatic  Eyes: Sclera and conjunctiva clear; pupils  round and reactive to light; extraocular movements intact  Ears: External normal; canals clear; tympanic membranes normal  Oropharynx: Pink, supple. No suspicious lesions  Neck: Supple without thyromegaly, adenopathy  Lungs: Respirations unlabored; clear to auscultation bilaterally without wheeze, rales, rhonchi  CVS exam: normal rate and regular rhythm.  Neurologic: Alert and oriented; speech intact; face symmetrical; moves all extremities well; CNII-XII intact without focal deficit   Assessment:  1. Class 2 severe obesity with serious comorbidity and body mass index (BMI) of 37.0 to 37.9 in adult, unspecified obesity type ([place])   2. Visit for screening mammogram   3. Allergic rhinitis, unspecified seasonality, unspecified trigger     Plan:  Update labs today; trial of Wegovy .25 mg weekly x 4 weeks; she will reach out at 1 month mark to plan to increase dosage; plan for OV in 2-3 months; Order updated- due April 2023; Trial of Flonase in addition to Zyrtec; follow up worse, no better- to consider antibiotic.  This visit occurred during the SARS-CoV-2 public health emergency.  Safety protocols were in place, including screening questions prior to the visit, additional usage of staff PPE, and extensive cleaning of exam room while observing appropriate contact time as indicated for disinfecting solutions.    No follow-ups on file.  Orders Placed This Encounter  Procedures   MM Digital Screening    Standing Status:   Future    Standing Expiration Date:   01/02/2023    Order Specific Question:   Reason for Exam (SYMPTOM  OR DIAGNOSIS REQUIRED)    Answer:   screening mammogram    Order Specific Question:   Is the patient pregnant?    Answer:   No    Order Specific Question:   Preferred imaging location?    Answer:   [facility]   CBC with Differential/Platelet   Comp Met (CMET)   Hemoglobin A1c    Requested Prescriptions   Signed Prescriptions Disp Refills   Semaglutide-Weight  Management (WEGOVY) 0.25 MG/0.5ML SOAJ 2 mL 0    Sig: Inject 0.25 mg into the skin once a week.   fluticasone (FLONASE) 50 MCG/ACT nasal spray 16 g 6    Sig: Place 2 sprays into both nostrils daily.

## 2022-01-03 ENCOUNTER — Other Ambulatory Visit (HOSPITAL_BASED_OUTPATIENT_CLINIC_OR_DEPARTMENT_OTHER): Payer: Self-pay

## 2022-01-04 ENCOUNTER — Other Ambulatory Visit (HOSPITAL_BASED_OUTPATIENT_CLINIC_OR_DEPARTMENT_OTHER): Payer: Self-pay

## 2022-01-04 NOTE — Telephone Encounter (Signed)
PA approved.   The request has been approved. The authorization is effective for a maximum of 3 fills from 01/04/2022 to 04/02/2022, as long as the member is enrolled in their current health plan. The request was approved as submitted. This request has been approved for a quantity of 54mL per 28 days.Additional authorizations have been entered for the following: Wegovy 0.5 mg/0.5 mL allowing 67mL per 28 days for 3 fills; please reference authorization [id number], Wegovy 1 mg/0.5 mL allowing 78mL per 28 days for 3 fills; please reference authorization [id number], Wegovy 1.7 mg/0.5 mL allowing 66mL per 28 days for 3 fills; please reference authorization [id number], Wegovy 2.4 mg/0.5 mL allowing 23mL per 28 days for 3 fills; please reference authorization [id number], effective 01/04/2022 through 04/02/2022. A written notification letter will follow with additional details.

## 2022-01-23 ENCOUNTER — Other Ambulatory Visit (HOSPITAL_BASED_OUTPATIENT_CLINIC_OR_DEPARTMENT_OTHER): Payer: Self-pay

## 2022-01-23 ENCOUNTER — Other Ambulatory Visit: Payer: Self-pay | Admitting: Family

## 2022-01-23 ENCOUNTER — Encounter: Payer: Self-pay | Admitting: Family

## 2022-01-23 MED ORDER — WEGOVY 0.5 MG/0.5ML ~~LOC~~ SOAJ
0.5000 mg | SUBCUTANEOUS | 0 refills | Status: DC
  Filled 2022-01-23: qty 2, 28d supply, fill #0

## 2022-02-26 ENCOUNTER — Other Ambulatory Visit: Payer: Self-pay

## 2022-02-26 ENCOUNTER — Other Ambulatory Visit (HOSPITAL_BASED_OUTPATIENT_CLINIC_OR_DEPARTMENT_OTHER): Payer: Self-pay

## 2022-02-26 ENCOUNTER — Encounter: Payer: Self-pay | Admitting: Family

## 2022-02-26 MED ORDER — WEGOVY 1 MG/0.5ML ~~LOC~~ SOAJ
1.0000 mg | SUBCUTANEOUS | 0 refills | Status: DC
  Filled 2022-02-26: qty 2, 28d supply, fill #0

## 2022-03-08 ENCOUNTER — Ambulatory Visit
Admission: RE | Admit: 2022-03-08 | Discharge: 2022-03-08 | Disposition: A | Discharge status: Home / Self Care | Payer: 59 | Source: Ambulatory Visit | Attending: Family | Admitting: Family

## 2022-03-08 DIAGNOSIS — Z1231 Encounter for screening mammogram for malignant neoplasm of breast: Secondary | ICD-10-CM | POA: Diagnosis not present

## 2022-03-13 ENCOUNTER — Other Ambulatory Visit (HOSPITAL_BASED_OUTPATIENT_CLINIC_OR_DEPARTMENT_OTHER): Payer: Self-pay

## 2022-03-13 ENCOUNTER — Ambulatory Visit: Payer: 59 | Admitting: Family

## 2022-03-13 VITALS — BP 110/60 | HR 83 | Temp 98.3°F | Resp 18 | Ht 64.5 in | Wt 215.0 lb

## 2022-03-13 DIAGNOSIS — F419 Anxiety disorder, unspecified: Secondary | ICD-10-CM | POA: Diagnosis not present

## 2022-03-13 DIAGNOSIS — F32A Depression, unspecified: Secondary | ICD-10-CM | POA: Diagnosis not present

## 2022-03-13 DIAGNOSIS — Z6836 Body mass index (BMI) 36.0-36.9, adult: Secondary | ICD-10-CM

## 2022-03-13 DIAGNOSIS — I10 Essential (primary) hypertension: Secondary | ICD-10-CM

## 2022-03-13 LAB — CBC WITH DIFFERENTIAL/PLATELET
Basophils Absolute: 0.1 10*3/uL (ref 0.0–0.1)
Basophils Relative: 1 % (ref 0.0–3.0)
Eosinophils Absolute: 0.1 10*3/uL (ref 0.0–0.7)
Eosinophils Relative: 1.7 % (ref 0.0–5.0)
HCT: 40.1 % (ref 36.0–46.0)
Hemoglobin: 12.9 g/dL (ref 12.0–15.0)
Lymphocytes Relative: 38.4 % (ref 12.0–46.0)
Lymphs Abs: 2.5 10*3/uL (ref 0.7–4.0)
MCHC: 32.2 g/dL (ref 30.0–36.0)
MCV: 78.4 fl (ref 78.0–100.0)
Monocytes Absolute: 0.5 10*3/uL (ref 0.1–1.0)
Monocytes Relative: 7.4 % (ref 3.0–12.0)
Neutro Abs: 3.4 10*3/uL (ref 1.4–7.7)
Neutrophils Relative %: 51.5 % (ref 43.0–77.0)
Platelets: 304 10*3/uL (ref 150.0–400.0)
RBC: 5.11 Mil/uL (ref 3.87–5.11)
RDW: 14.8 % (ref 11.5–15.5)
WBC: 6.5 10*3/uL (ref 4.0–10.5)

## 2022-03-13 LAB — COMPREHENSIVE METABOLIC PANEL
ALT: 29 U/L (ref 0–35)
AST: 22 U/L (ref 0–37)
Albumin: 4.3 g/dL (ref 3.5–5.2)
Alkaline Phosphatase: 63 U/L (ref 39–117)
BUN: 11 mg/dL (ref 6–23)
CO2: 31 mEq/L (ref 19–32)
Calcium: 9.6 mg/dL (ref 8.4–10.5)
Chloride: 100 mEq/L (ref 96–112)
Creatinine, Ser: 0.71 mg/dL (ref 0.40–1.20)
GFR: 100.92 mL/min (ref >60.00)
Glucose, Bld: 80 mg/dL (ref 70–99)
Potassium: 3.9 mEq/L (ref 3.5–5.1)
Sodium: 138 mEq/L (ref 135–145)
Total Bilirubin: 0.6 mg/dL (ref 0.2–1.2)
Total Protein: 6.9 g/dL (ref 6.0–8.3)

## 2022-03-13 MED ORDER — LORAZEPAM 0.5 MG PO TABS
ORAL_TABLET | ORAL | 1 refills | Status: DC
  Filled 2022-03-13: qty 30, 30d supply, fill #0
  Filled 2022-06-18: qty 30, 30d supply, fill #1

## 2022-03-13 MED ORDER — WEGOVY 1 MG/0.5ML ~~LOC~~ SOAJ
1.0000 mg | SUBCUTANEOUS | 0 refills | Status: DC
Start: 2022-03-13 — End: 2022-06-12
  Filled 2022-03-13 – 2022-03-25 (×2): qty 6, 84d supply, fill #0

## 2022-03-13 NOTE — Progress Notes (Signed)
?Jessica Griffin is a 46 y.o. female with the following history as recorded in EpicCare:  ?Patient Active Problem List  ? Diagnosis Date Noted  ? Acute frontal sinusitis 09/01/2021  ? Postoperative state 12/02/2019  ? Anxiety and depression 09/16/2017  ? Essential hypertension 03/06/2017  ?  ?Current Outpatient Medications  ?Medication Sig Dispense Refill  ? aspirin EC 81 MG tablet Take 81 mg by mouth daily.     ? azelastine (OPTIVAR) 0.05 % ophthalmic solution Place 1 drop into both eyes 2 (two) times daily. 6 mL 0  ? Biotin 5 MG TABS Take 2 tablets by mouth daily.    ? cetirizine (ZYRTEC) 10 MG chewable tablet Chew 10 mg by mouth daily.    ? Cholecalciferol (VITAMIN D3) 125 MCG (5000 UT) CAPS Take 1 capsule by mouth daily.    ? fluticasone (FLONASE) 50 MCG/ACT nasal spray Place 2 sprays into both nostrils daily. 16 g 6  ? loratadine (CLARITIN) 10 MG tablet Take 10 mg by mouth daily as needed for allergies.     ? losartan-hydrochlorothiazide (HYZAAR) 100-25 MG tablet TAKE 1 TABLET BY MOUTH ONCE DAILY 90 tablet 3  ? Multiple Vitamin (MULTIVITAMIN) capsule Take 1 capsule by mouth daily.     ? Omega-3 1000 MG CAPS Take 1,000 mg by mouth daily.     ? potassium chloride SA (KLOR-CON M) 20 MEQ tablet TAKE 1 TABLET BY MOUTH ONCE DAILY 90 tablet 3  ? Probiotic Product (CVS ADV PROBIOTIC GUMMIES) CHEW Chew 2 each by mouth daily.    ? vitamin B-12 (CYANOCOBALAMIN) 1000 MCG tablet Take by mouth.    ? vitamin C (ASCORBIC ACID) 500 MG tablet Take by mouth.    ? LORazepam (ATIVAN) 0.5 MG tablet TAKE 1 TABLET BY MOUTH ONCE DAILY AS NEEDED FOR ANXIETY 30 tablet 1  ? Semaglutide-Weight Management (WEGOVY) 1 MG/0.5ML SOAJ Inject 1 mg into the skin once a week. 6 mL 0  ? ?No current facility-administered medications for this visit.  ?  ?Allergies: Sulfa antibiotics  ?Past Medical History:  ?Diagnosis Date  ? Anxiety   ? Astigmatism   ? Bilateral swelling of feet and ankles   ? Constipation   ? Depression   ? DUB (dysfunctional  uterine bleeding)   ? Enlarged heart   ? Hypertension   ? Myopia   ? Other fatigue   ? Palpitations   ? Presbyopia   ? Shortness of breath on exertion   ? SOB (shortness of breath)   ? Uterine fibroid   ?  ?Past Surgical History:  ?Procedure Laterality Date  ? COLONOSCOPY  08/09/2020  ? ROBOTIC ASSISTED TOTAL HYSTERECTOMY WITH BILATERAL SALPINGO OOPHERECTOMY Bilateral 12/02/2019  ? Procedure: XI ROBOTIC ASSISTED TOTAL HYSTERECTOMY WITH BILATERAL SALPINGECTOMY;  Surgeon: [name], MD;  Location: [place];  Service: Gynecology;  Laterality: Bilateral;  ? TONSILLECTOMY  1996  ?  ?Family History  ?Problem Relation Age of Onset  ? Arthritis Mother   ? Miscarriages / Stillbirths Sister   ? Arthritis Maternal Grandmother   ? Diabetes Maternal Grandmother   ? Diabetes Maternal Grandfather   ? Hearing loss Maternal Grandfather   ? Heart disease Maternal Grandfather   ? High blood pressure Maternal Grandfather   ? Stroke Maternal Grandfather   ? Colon cancer Neg Hx   ? Esophageal cancer Neg Hx   ? Stomach cancer Neg Hx   ? Rectal cancer Neg Hx   ?  ?Social History  ? ?Tobacco Use  ?  Smoking status: Never  ? Smokeless tobacco: Never  ?Substance Use Topics  ? Alcohol use: Yes  ?  Comment: occasional  ?  ?Subjective:  ?Follow up on weight loss/ response to [facility]; doing well on 1 mg dosage but does not want to increase dosage; does have some occasional nausea on the first day after injection; would like to try and get 90 day supply if possible;  ? ?Also needing refill on Ativan to use prn;  ? ? ?Objective:  ?Vitals:  ? 03/13/22 1025  ?BP: 110/60  ?Pulse: 83  ?Resp: 18  ?Temp: 98.3 ?F (36.8 ?C)  ?TempSrc: Oral  ?SpO2: 99%  ?Weight: 215 lb (97.5 kg)  ?Height: 5' 4.5" (1.638 m)  ?  ?General: Well developed, well nourished, in no acute distress  ?Skin : Warm and dry.  ?Head: Normocephalic and atraumatic  ?Lungs: Respirations unlabored; clear to auscultation bilaterally without wheeze, rales, rhonchi  ?CVS  exam: normal rate and regular rhythm.  ?Neurologic: Alert and oriented; speech intact; face symmetrical; moves all extremities well; CNII-XII intact without focal deficit  ? ?Assessment:  ?1. Primary hypertension   ?2. Anxiety and depression   ?3. BMI 36.0-36.9,adult   ?  ?Plan:  ?Stable; check CBC, CMP; ?Refill updated on Ativan; ?Responding well to [facility]- refill updated; continue 1 mg weekly for now; ? ?This visit occurred during the SARS-CoV-2 public health emergency.  Safety protocols were in place, including screening questions prior to the visit, additional usage of staff PPE, and extensive cleaning of exam room while observing appropriate contact time as indicated for disinfecting solutions.  ? ? ?No follow-ups on file.  ?Orders Placed This Encounter  ?Procedures  ? CBC with Differential/Platelet  ? Comp Met (CMET)  ?  ?Requested Prescriptions  ? ?Signed Prescriptions Disp Refills  ? Semaglutide-Weight Management (WEGOVY) 1 MG/0.5ML SOAJ 6 mL 0  ?  Sig: Inject 1 mg into the skin once a week.  ? LORazepam (ATIVAN) 0.5 MG tablet 30 tablet 1  ?  Sig: TAKE 1 TABLET BY MOUTH ONCE DAILY AS NEEDED FOR ANXIETY  ?  ? ?

## 2022-03-14 ENCOUNTER — Other Ambulatory Visit (HOSPITAL_BASED_OUTPATIENT_CLINIC_OR_DEPARTMENT_OTHER): Payer: Self-pay

## 2022-03-16 ENCOUNTER — Other Ambulatory Visit (HOSPITAL_BASED_OUTPATIENT_CLINIC_OR_DEPARTMENT_OTHER): Payer: Self-pay

## 2022-03-26 ENCOUNTER — Other Ambulatory Visit (HOSPITAL_BASED_OUTPATIENT_CLINIC_OR_DEPARTMENT_OTHER): Payer: Self-pay

## 2022-03-27 ENCOUNTER — Other Ambulatory Visit (HOSPITAL_BASED_OUTPATIENT_CLINIC_OR_DEPARTMENT_OTHER): Payer: Self-pay

## 2022-06-09 ENCOUNTER — Other Ambulatory Visit: Payer: Self-pay | Admitting: Family

## 2022-06-11 ENCOUNTER — Telehealth: Payer: Self-pay

## 2022-06-11 NOTE — Telephone Encounter (Signed)
Error

## 2022-06-12 ENCOUNTER — Encounter: Payer: Self-pay | Admitting: Family

## 2022-06-12 ENCOUNTER — Other Ambulatory Visit (HOSPITAL_BASED_OUTPATIENT_CLINIC_OR_DEPARTMENT_OTHER): Payer: Self-pay

## 2022-06-12 ENCOUNTER — Other Ambulatory Visit: Payer: Self-pay | Admitting: Family

## 2022-06-12 MED ORDER — WEGOVY 1.7 MG/0.75ML ~~LOC~~ SOAJ
1.7000 mg | SUBCUTANEOUS | 1 refills | Status: DC
  Filled 2022-06-12: qty 3, 28d supply, fill #0
  Filled 2022-08-08: qty 3, 28d supply, fill #1

## 2022-06-14 ENCOUNTER — Other Ambulatory Visit (HOSPITAL_BASED_OUTPATIENT_CLINIC_OR_DEPARTMENT_OTHER): Payer: Self-pay

## 2022-06-18 ENCOUNTER — Other Ambulatory Visit (HOSPITAL_BASED_OUTPATIENT_CLINIC_OR_DEPARTMENT_OTHER): Payer: Self-pay

## 2022-06-19 ENCOUNTER — Other Ambulatory Visit (HOSPITAL_BASED_OUTPATIENT_CLINIC_OR_DEPARTMENT_OTHER): Payer: Self-pay

## 2022-06-20 ENCOUNTER — Other Ambulatory Visit (HOSPITAL_BASED_OUTPATIENT_CLINIC_OR_DEPARTMENT_OTHER): Payer: Self-pay

## 2022-06-21 ENCOUNTER — Other Ambulatory Visit (HOSPITAL_BASED_OUTPATIENT_CLINIC_OR_DEPARTMENT_OTHER): Payer: Self-pay

## 2022-06-22 ENCOUNTER — Other Ambulatory Visit (HOSPITAL_BASED_OUTPATIENT_CLINIC_OR_DEPARTMENT_OTHER): Payer: Self-pay

## 2022-06-25 ENCOUNTER — Other Ambulatory Visit (HOSPITAL_BASED_OUTPATIENT_CLINIC_OR_DEPARTMENT_OTHER): Payer: Self-pay

## 2022-06-26 ENCOUNTER — Other Ambulatory Visit (HOSPITAL_BASED_OUTPATIENT_CLINIC_OR_DEPARTMENT_OTHER): Payer: Self-pay

## 2022-06-27 ENCOUNTER — Other Ambulatory Visit (HOSPITAL_BASED_OUTPATIENT_CLINIC_OR_DEPARTMENT_OTHER): Payer: Self-pay

## 2022-06-29 ENCOUNTER — Other Ambulatory Visit (HOSPITAL_BASED_OUTPATIENT_CLINIC_OR_DEPARTMENT_OTHER): Payer: Self-pay

## 2022-07-03 ENCOUNTER — Telehealth: Payer: Self-pay

## 2022-07-03 ENCOUNTER — Other Ambulatory Visit (HOSPITAL_BASED_OUTPATIENT_CLINIC_OR_DEPARTMENT_OTHER): Payer: Self-pay

## 2022-07-03 NOTE — Telephone Encounter (Signed)
PA initiated via Covermymeds; KEY: [id number]. Awaiting determination.

## 2022-07-04 ENCOUNTER — Encounter (INDEPENDENT_AMBULATORY_CARE_PROVIDER_SITE_OTHER): Payer: Self-pay

## 2022-07-04 ENCOUNTER — Encounter: Payer: Self-pay | Admitting: Family

## 2022-07-04 NOTE — Telephone Encounter (Signed)
Further questions received via fax- answered and faxed back to [place] at [phone]. Awaiting determination.

## 2022-07-05 ENCOUNTER — Other Ambulatory Visit (HOSPITAL_BASED_OUTPATIENT_CLINIC_OR_DEPARTMENT_OTHER): Payer: Self-pay

## 2022-07-05 ENCOUNTER — Encounter (HOSPITAL_BASED_OUTPATIENT_CLINIC_OR_DEPARTMENT_OTHER): Payer: Self-pay

## 2022-07-05 NOTE — Telephone Encounter (Signed)
PA approved.   The request has been approved. The authorization is effective for a maximum of 12 fills from 07/05/2022 to 07/05/2023, as long as the member is enrolled in their current health plan. The request was approved as submitted. This request has been approved for 71m per 28 days.Additional prior authorizations (PA) have been entered fENM:MHWKGS0.'25mg'$ /0.575mfor a maximum of 12 fills with a quantity limit of 4 pens (63m67mper 28 days ([place] [id number])Wegovy 0.'5mg'$ /0.5mL70mr a maximum of 12 fills with a quantity limit of 4 pens (63mL)94mr 28 days ([place] 19265)Wegovy '1mg'$ /0.5mL f51ma maximum of 12 fills with a quantity limit of 4 pens (63mL) p29m28 days ([place] 19266)Wegovy 2.'4mg'$ /0.75mL fo63mmaximum of 12 fills with a quantity limit of 4 pens (3mL) per29m days ([place] [id number])effective 07/05/2022 through 07/05/2023. A written notification letter will follow with additional details

## 2022-07-09 ENCOUNTER — Other Ambulatory Visit (HOSPITAL_BASED_OUTPATIENT_CLINIC_OR_DEPARTMENT_OTHER): Payer: Self-pay

## 2022-08-08 ENCOUNTER — Other Ambulatory Visit (HOSPITAL_BASED_OUTPATIENT_CLINIC_OR_DEPARTMENT_OTHER): Payer: Self-pay

## 2022-09-17 ENCOUNTER — Other Ambulatory Visit: Payer: Self-pay | Admitting: Family

## 2022-09-17 ENCOUNTER — Other Ambulatory Visit (HOSPITAL_BASED_OUTPATIENT_CLINIC_OR_DEPARTMENT_OTHER): Payer: Self-pay

## 2022-09-17 MED ORDER — WEGOVY 1.7 MG/0.75ML ~~LOC~~ SOAJ
1.7000 mg | SUBCUTANEOUS | 1 refills | Status: DC
  Filled 2022-09-17: qty 3, 28d supply, fill #0
  Filled 2022-10-12: qty 3, 28d supply, fill #1

## 2022-09-17 MED ORDER — POTASSIUM CHLORIDE CRYS ER 20 MEQ PO TBCR
EXTENDED_RELEASE_TABLET | Freq: Every day | ORAL | 3 refills | Status: DC
  Filled 2022-09-17: qty 90, 90d supply, fill #0
  Filled 2023-01-06: qty 90, 90d supply, fill #1
  Filled 2023-04-04: qty 90, 90d supply, fill #2
  Filled 2023-07-06: qty 90, 90d supply, fill #3

## 2022-09-17 MED ORDER — LOSARTAN POTASSIUM-HCTZ 100-25 MG PO TABS
1.0000 | ORAL_TABLET | Freq: Every day | ORAL | 3 refills | Status: DC
  Filled 2022-09-17: qty 90, 90d supply, fill #0
  Filled 2023-01-06: qty 90, 90d supply, fill #1
  Filled 2023-04-04: qty 90, 90d supply, fill #2
  Filled 2023-07-06: qty 90, 90d supply, fill #3

## 2022-10-12 ENCOUNTER — Other Ambulatory Visit: Payer: Self-pay | Admitting: Family

## 2022-10-12 ENCOUNTER — Other Ambulatory Visit (HOSPITAL_BASED_OUTPATIENT_CLINIC_OR_DEPARTMENT_OTHER): Payer: Self-pay

## 2022-10-12 MED ORDER — LORAZEPAM 0.5 MG PO TABS
0.5000 mg | ORAL_TABLET | Freq: Every day | ORAL | 1 refills | Status: DC | PRN
  Filled 2022-10-12: qty 30, 30d supply, fill #0
  Filled 2023-03-26: qty 30, 30d supply, fill #1

## 2022-10-12 NOTE — Telephone Encounter (Signed)
Requesting: lorazepam 0.'5mg'$   Contract: None UDS: None Last Visit: 03/13/22 Next Visit: None Last Refill: 03/13/22 #30 and 0RF  Please Advise

## 2022-11-23 ENCOUNTER — Other Ambulatory Visit: Payer: Self-pay | Admitting: Family

## 2022-11-26 ENCOUNTER — Other Ambulatory Visit: Payer: Self-pay

## 2022-11-27 ENCOUNTER — Other Ambulatory Visit (HOSPITAL_BASED_OUTPATIENT_CLINIC_OR_DEPARTMENT_OTHER): Payer: Self-pay

## 2022-11-27 ENCOUNTER — Encounter: Payer: Self-pay | Admitting: Family

## 2022-11-27 ENCOUNTER — Telehealth: Payer: Self-pay

## 2022-11-27 MED ORDER — WEGOVY 1.7 MG/0.75ML ~~LOC~~ SOAJ
1.7000 mg | SUBCUTANEOUS | 0 refills | Status: DC
  Filled 2022-11-27: qty 3, 28d supply, fill #0

## 2022-11-27 NOTE — Telephone Encounter (Signed)
Pt overdue for appt to check weight after starting [facility]. PCP has already made her aware. PA will be completed after that appt.

## 2022-11-29 ENCOUNTER — Other Ambulatory Visit (HOSPITAL_BASED_OUTPATIENT_CLINIC_OR_DEPARTMENT_OTHER): Payer: Self-pay

## 2022-11-30 ENCOUNTER — Other Ambulatory Visit (HOSPITAL_BASED_OUTPATIENT_CLINIC_OR_DEPARTMENT_OTHER): Payer: Self-pay

## 2022-12-03 NOTE — Telephone Encounter (Signed)
Appt scheduled 12/11/22

## 2022-12-08 ENCOUNTER — Other Ambulatory Visit (HOSPITAL_BASED_OUTPATIENT_CLINIC_OR_DEPARTMENT_OTHER): Payer: Self-pay

## 2022-12-11 ENCOUNTER — Ambulatory Visit (INDEPENDENT_AMBULATORY_CARE_PROVIDER_SITE_OTHER): Payer: Commercial Managed Care - PPO | Admitting: Family

## 2022-12-11 ENCOUNTER — Other Ambulatory Visit (HOSPITAL_BASED_OUTPATIENT_CLINIC_OR_DEPARTMENT_OTHER): Payer: Self-pay

## 2022-12-11 ENCOUNTER — Encounter: Payer: Self-pay | Admitting: Family

## 2022-12-11 VITALS — BP 126/72 | HR 90 | Resp 18 | Ht 64.5 in | Wt 212.8 lb

## 2022-12-11 DIAGNOSIS — M79604 Pain in right leg: Secondary | ICD-10-CM

## 2022-12-11 DIAGNOSIS — R5383 Other fatigue: Secondary | ICD-10-CM | POA: Diagnosis not present

## 2022-12-11 DIAGNOSIS — R232 Flushing: Secondary | ICD-10-CM

## 2022-12-11 DIAGNOSIS — M79605 Pain in left leg: Secondary | ICD-10-CM | POA: Diagnosis not present

## 2022-12-11 DIAGNOSIS — R7309 Other abnormal glucose: Secondary | ICD-10-CM

## 2022-12-11 LAB — CBC WITH DIFFERENTIAL/PLATELET
Basophils Absolute: 0 10*3/uL (ref 0.0–0.1)
Basophils Relative: 0.7 % (ref 0.0–3.0)
Eosinophils Absolute: 0.1 10*3/uL (ref 0.0–0.7)
Eosinophils Relative: 1.1 % (ref 0.0–5.0)
HCT: 41.7 % (ref 36.0–46.0)
Hemoglobin: 13.8 g/dL (ref 12.0–15.0)
Lymphocytes Relative: 35.2 % (ref 12.0–46.0)
Lymphs Abs: 2.4 10*3/uL (ref 0.7–4.0)
MCHC: 33.2 g/dL (ref 30.0–36.0)
MCV: 79.2 fl (ref 78.0–100.0)
Monocytes Absolute: 0.5 10*3/uL (ref 0.1–1.0)
Monocytes Relative: 6.8 % (ref 3.0–12.0)
Neutro Abs: 3.8 10*3/uL (ref 1.4–7.7)
Neutrophils Relative %: 56.2 % (ref 43.0–77.0)
Platelets: 327 10*3/uL (ref 150.0–400.0)
RBC: 5.27 Mil/uL — ABNORMAL HIGH (ref 3.87–5.11)
RDW: 14 % (ref 11.5–15.5)
WBC: 6.8 10*3/uL (ref 4.0–10.5)

## 2022-12-11 LAB — COMPREHENSIVE METABOLIC PANEL
ALT: 26 U/L (ref 0–35)
AST: 20 U/L (ref 0–37)
Albumin: 4.4 g/dL (ref 3.5–5.2)
Alkaline Phosphatase: 55 U/L (ref 39–117)
BUN: 15 mg/dL (ref 6–23)
CO2: 30 mEq/L (ref 19–32)
Calcium: 9.7 mg/dL (ref 8.4–10.5)
Chloride: 99 mEq/L (ref 96–112)
Creatinine, Ser: 0.7 mg/dL (ref 0.40–1.20)
GFR: 102.12 mL/min (ref >60.00)
Glucose, Bld: 87 mg/dL (ref 70–99)
Potassium: 4.3 mEq/L (ref 3.5–5.1)
Sodium: 138 mEq/L (ref 135–145)
Total Bilirubin: 0.3 mg/dL (ref 0.2–1.2)
Total Protein: 7 g/dL (ref 6.0–8.3)

## 2022-12-11 LAB — HEMOGLOBIN A1C: Hgb A1c MFr Bld: 5.4 % (ref 4.6–6.5)

## 2022-12-11 LAB — VITAMIN B12: Vitamin B-12: 950 pg/mL — ABNORMAL HIGH (ref 211–911)

## 2022-12-11 LAB — FOLLICLE STIMULATING HORMONE: FSH: 42.5 m[IU]/mL

## 2022-12-11 LAB — LUTEINIZING HORMONE: LH: 25.6 m[IU]/mL

## 2022-12-11 LAB — TSH: TSH: 1.72 u[IU]/mL (ref 0.35–5.50)

## 2022-12-11 MED ORDER — WEGOVY 2.4 MG/0.75ML ~~LOC~~ SOAJ
2.4000 mg | SUBCUTANEOUS | 0 refills | Status: DC
  Filled 2022-12-11: qty 3, 28d supply, fill #0

## 2022-12-11 NOTE — Progress Notes (Signed)
Jessica Griffin is a 47 y.o. female with the following history as recorded in EpicCare:  Patient Active Problem List   Diagnosis Date Noted   Acute frontal sinusitis 09/01/2021   Postoperative state 12/02/2019   Anxiety and depression 09/16/2017   Essential hypertension 03/06/2017    Current Outpatient Medications  Medication Sig Dispense Refill   aspirin EC 81 MG tablet Take 81 mg by mouth daily.      azelastine (OPTIVAR) 0.05 % ophthalmic solution Place 1 drop into both eyes 2 (two) times daily. 6 mL 0   Biotin 5 MG TABS Take 2 tablets by mouth daily.     cetirizine (ZYRTEC) 10 MG chewable tablet Chew 10 mg by mouth daily.     Cholecalciferol (VITAMIN D3) 125 MCG (5000 UT) CAPS Take 1 capsule by mouth daily.     fluticasone (FLONASE) 50 MCG/ACT nasal spray Place 2 sprays into both nostrils daily. 16 g 6   loratadine (CLARITIN) 10 MG tablet Take 10 mg by mouth daily as needed for allergies.      LORazepam (ATIVAN) 0.5 MG tablet Take 1 tablet (0.5 mg total) by mouth daily as needed for anxiety. 30 tablet 1   losartan-hydrochlorothiazide (HYZAAR) 100-25 MG tablet TAKE 1 TABLET BY MOUTH ONCE DAILY 90 tablet 3   magnesium gluconate (MAGONATE) 500 MG tablet Take 500 mg by mouth 2 (two) times daily.     Multiple Vitamin (MULTIVITAMIN) capsule Take 1 capsule by mouth daily.      Omega-3 1000 MG CAPS Take 1,000 mg by mouth daily.      potassium chloride SA (KLOR-CON M) 20 MEQ tablet TAKE 1 TABLET BY MOUTH ONCE DAILY 90 tablet 3   Probiotic Product (CVS ADV PROBIOTIC GUMMIES) CHEW Chew 2 each by mouth daily.     Semaglutide-Weight Management (WEGOVY) 2.4 MG/0.75ML SOAJ Inject 2.4 mg into the skin once a week. 3 mL 0   vitamin B-12 (CYANOCOBALAMIN) 1000 MCG tablet Take by mouth.     vitamin C (ASCORBIC ACID) 500 MG tablet Take by mouth.     No current facility-administered medications for this visit.    Allergies: Sulfa antibiotics  Past Medical History:  Diagnosis Date   Anxiety     Astigmatism    Bilateral swelling of feet and ankles    Constipation    Depression    DUB (dysfunctional uterine bleeding)    Enlarged heart    Hypertension    Myopia    Other fatigue    Palpitations    Presbyopia    Shortness of breath on exertion    SOB (shortness of breath)    Uterine fibroid     Past Surgical History:  Procedure Laterality Date   COLONOSCOPY  08/09/2020   ROBOTIC ASSISTED TOTAL HYSTERECTOMY WITH BILATERAL SALPINGO OOPHERECTOMY Bilateral 12/02/2019   Procedure: XI ROBOTIC ASSISTED TOTAL HYSTERECTOMY WITH BILATERAL SALPINGECTOMY;  Surgeon: [name], MD;  Location: [place];  Service: Gynecology;  Laterality: Bilateral;   TONSILLECTOMY  1996    Family History  Problem Relation Age of Onset   Arthritis Mother    Miscarriages / [place] Sister    Arthritis Maternal Grandmother    Diabetes Maternal Grandmother    Diabetes Maternal Grandfather    Hearing loss Maternal Grandfather    Heart disease Maternal Grandfather    High blood pressure Maternal Grandfather    Stroke Maternal Grandfather    Colon cancer Neg Hx    Esophageal cancer Neg Hx  Stomach cancer Neg Hx    Rectal cancer Neg Hx     Social History   Tobacco Use   Smoking status: Never   Smokeless tobacco: Never  Substance Use Topics   Alcohol use: Yes    Comment: occasional    Subjective:   Follow up on Wegovy- would like to consider increasing dosage; Has been having increased hot flashes recently; does not want to take hormones; has been using Magnesium with good response;   Objective:  Vitals:   12/11/22 1020  BP: 126/72  Pulse: 90  Resp: 18  SpO2: 98%  Weight: 212 lb 12.8 oz (96.5 kg)  Height: 5' 4.5" (1.638 m)    General: Well developed, well nourished, in no acute distress  Skin : Warm and dry.  Head: Normocephalic and atraumatic  Lungs: Respirations unlabored; clear to auscultation bilaterally without wheeze, rales, rhonchi  CVS exam: normal  rate and regular rhythm.  Musculoskeletal: No deformities; no active joint inflammation  Extremities: No edema, cyanosis, clubbing  Vessels: Symmetric bilaterally  Neurologic: Alert and oriented; speech intact; face symmetrical; moves all extremities well; CNII-XII intact without focal deficit   Assessment:  1. Other fatigue   2. Elevated glucose   3. Bilateral leg pain   4. Hot flashes     Plan:  Will update labs today; increase Wegovy to 2.4 mg weekly- risks/ benefits discussed; Discussed non-hormonal options for menopausal symptoms- Estroven vs Effexor Xr; she is given information to review and will call back if she wants to discuss other options;   Time spent today 30 minutes- discussing symptoms/ management options  No follow-ups on file.  Orders Placed This Encounter  Procedures   CBC with Differential/Platelet   Comp Met (CMET)   Hemoglobin A1c   B12   TSH   FSH   LH    Requested Prescriptions   Signed Prescriptions Disp Refills   Semaglutide-Weight Management (WEGOVY) 2.4 MG/0.75ML SOAJ 3 mL 0    Sig: Inject 2.4 mg into the skin once a week.

## 2022-12-11 NOTE — Telephone Encounter (Signed)
IOEVOJ 2.'4mg'$ - PA initiated via Covermymeds; KEY:  [id number].  Member has an active PA on file which is expiring on 07/05/2023 and has 12 no. of fills remaining.

## 2022-12-11 NOTE — Patient Instructions (Signed)
Managing Hot Flashes During Menopause You will learn what hot flashes/night sweats are, what causes them and what the treatment methods are. To view the content, go to this web address: [email or web address]  This video will expire on: 07/31/2024. If you need access to this video following this date, please reach out to the healthcare provider who assigned it to you. This information is not intended to replace advice given to you by your health care provider. Make sure you discuss any questions you have with your health care provider. Elsevier Patient Education  2023 [employer]  

## 2022-12-31 ENCOUNTER — Other Ambulatory Visit (HOSPITAL_BASED_OUTPATIENT_CLINIC_OR_DEPARTMENT_OTHER): Payer: Self-pay

## 2023-01-06 ENCOUNTER — Other Ambulatory Visit: Payer: Self-pay | Admitting: Family

## 2023-01-07 ENCOUNTER — Other Ambulatory Visit (HOSPITAL_BASED_OUTPATIENT_CLINIC_OR_DEPARTMENT_OTHER): Payer: Self-pay

## 2023-01-07 MED ORDER — WEGOVY 2.4 MG/0.75ML ~~LOC~~ SOAJ
2.4000 mg | SUBCUTANEOUS | 2 refills | Status: DC
  Filled 2023-01-07: qty 3, 28d supply, fill #0
  Filled 2023-02-17: qty 3, 28d supply, fill #1
  Filled 2023-03-26 – 2023-04-01 (×2): qty 3, 28d supply, fill #2

## 2023-02-18 ENCOUNTER — Other Ambulatory Visit (HOSPITAL_BASED_OUTPATIENT_CLINIC_OR_DEPARTMENT_OTHER): Payer: Self-pay

## 2023-02-25 ENCOUNTER — Other Ambulatory Visit (HOSPITAL_BASED_OUTPATIENT_CLINIC_OR_DEPARTMENT_OTHER): Payer: Self-pay

## 2023-03-26 ENCOUNTER — Other Ambulatory Visit (HOSPITAL_BASED_OUTPATIENT_CLINIC_OR_DEPARTMENT_OTHER): Payer: Self-pay

## 2023-03-26 ENCOUNTER — Other Ambulatory Visit: Payer: Self-pay

## 2023-03-26 ENCOUNTER — Other Ambulatory Visit: Payer: Self-pay | Admitting: Family

## 2023-03-26 MED ORDER — FLUTICASONE PROPIONATE 50 MCG/ACT NA SUSP
2.0000 | Freq: Every day | NASAL | 6 refills | Status: AC
  Filled 2023-03-26: qty 16, 30d supply, fill #0
  Filled 2023-07-06: qty 16, 30d supply, fill #1
  Filled 2023-10-10 – 2023-10-17 (×2): qty 16, 30d supply, fill #2

## 2023-04-01 ENCOUNTER — Other Ambulatory Visit (HOSPITAL_BASED_OUTPATIENT_CLINIC_OR_DEPARTMENT_OTHER): Payer: Self-pay

## 2023-04-04 ENCOUNTER — Other Ambulatory Visit (HOSPITAL_BASED_OUTPATIENT_CLINIC_OR_DEPARTMENT_OTHER): Payer: Self-pay

## 2023-06-13 ENCOUNTER — Other Ambulatory Visit: Payer: Self-pay | Admitting: Family

## 2023-06-13 DIAGNOSIS — Z1231 Encounter for screening mammogram for malignant neoplasm of breast: Secondary | ICD-10-CM

## 2023-06-14 ENCOUNTER — Ambulatory Visit
Admission: RE | Admit: 2023-06-14 | Discharge: 2023-06-14 | Disposition: A | Discharge status: Home / Self Care | Payer: Commercial Managed Care - PPO | Source: Ambulatory Visit | Attending: Family | Admitting: Family

## 2023-06-14 DIAGNOSIS — Z1231 Encounter for screening mammogram for malignant neoplasm of breast: Secondary | ICD-10-CM

## 2023-06-18 DIAGNOSIS — H5213 Myopia, bilateral: Secondary | ICD-10-CM | POA: Diagnosis not present

## 2023-06-18 DIAGNOSIS — H524 Presbyopia: Secondary | ICD-10-CM | POA: Diagnosis not present

## 2023-06-26 ENCOUNTER — Encounter (INDEPENDENT_AMBULATORY_CARE_PROVIDER_SITE_OTHER): Payer: Self-pay

## 2023-07-06 ENCOUNTER — Other Ambulatory Visit: Payer: Self-pay | Admitting: Family

## 2023-07-07 ENCOUNTER — Other Ambulatory Visit: Payer: Self-pay

## 2023-07-09 ENCOUNTER — Other Ambulatory Visit (HOSPITAL_COMMUNITY): Payer: Self-pay

## 2023-07-09 MED ORDER — LORAZEPAM 0.5 MG PO TABS
0.5000 mg | ORAL_TABLET | Freq: Every day | ORAL | 1 refills | Status: DC | PRN
  Filled 2023-07-09: qty 30, 30d supply, fill #0
  Filled 2023-10-10: qty 30, 30d supply, fill #1
  Filled 2023-10-17: qty 30, 30d supply, fill #0

## 2023-07-19 ENCOUNTER — Encounter: Payer: Self-pay | Admitting: Family

## 2023-07-19 ENCOUNTER — Ambulatory Visit (INDEPENDENT_AMBULATORY_CARE_PROVIDER_SITE_OTHER): Payer: Commercial Managed Care - PPO | Admitting: Family

## 2023-07-19 VITALS — BP 132/70 | HR 80 | Temp 98.3°F | Resp 18 | Ht 64.5 in | Wt 222.2 lb

## 2023-07-19 DIAGNOSIS — Z0000 Encounter for general adult medical examination without abnormal findings: Secondary | ICD-10-CM

## 2023-07-19 NOTE — Progress Notes (Signed)
Jessica Griffin is a 47 y.o. female with the following history as recorded in EpicCare:  Patient Active Problem List   Diagnosis Date Noted   Acute frontal sinusitis 09/01/2021   Postoperative state 12/02/2019   Anxiety and depression 09/16/2017   Essential hypertension 03/06/2017    Current Outpatient Medications  Medication Sig Dispense Refill   aspirin EC 81 MG tablet Take 81 mg by mouth daily.      Biotin 5 MG TABS Take 2 tablets by mouth daily.     cetirizine (ZYRTEC) 10 MG chewable tablet Chew 10 mg by mouth daily.     Cholecalciferol (VITAMIN D3) 125 MCG (5000 UT) CAPS Take 1 capsule by mouth daily.     fluticasone (FLONASE) 50 MCG/ACT nasal spray Place 2 sprays into both nostrils daily. 16 g 6   loratadine (CLARITIN) 10 MG tablet Take 10 mg by mouth daily as needed for allergies.      LORazepam (ATIVAN) 0.5 MG tablet Take 1 tablet (0.5 mg total) by mouth daily as needed for anxiety. 30 tablet 1   losartan-hydrochlorothiazide (HYZAAR) 100-25 MG tablet TAKE 1 TABLET BY MOUTH ONCE DAILY 90 tablet 3   magnesium gluconate (MAGONATE) 500 MG tablet Take 500 mg by mouth 2 (two) times daily.     Multiple Vitamin (MULTIVITAMIN) capsule Take 1 capsule by mouth daily.      Omega-3 1000 MG CAPS Take 1,000 mg by mouth daily.      potassium chloride SA (KLOR-CON M) 20 MEQ tablet TAKE 1 TABLET BY MOUTH ONCE DAILY 90 tablet 3   Probiotic Product ([employer]S ADV PROBIOTIC GUMMIES) CHEW Chew 2 each by mouth daily.     vitamin B-12 (CYANOCOBALAMIN) 1000 MCG tablet Take by mouth.     vitamin C (ASCORBIC ACID) 500 MG tablet Take by mouth.     azelastine (OPTIVAR) 0.05 % ophthalmic solution Place 1 drop into both eyes 2 (two) times daily. (Patient not taking: Reported on 07/19/2023) 6 mL 0   No current facility-administered medications for this visit.    Allergies: Sulfa antibiotics  Past Medical History:  Diagnosis Date   Anxiety    Astigmatism    Bilateral swelling of feet and ankles     Constipation    Depression    DUB (dysfunctional uterine bleeding)    Enlarged heart    Hypertension    Myopia    Other fatigue    Palpitations    Presbyopia    Shortness of breath on exertion    SOB (shortness of breath)    Uterine fibroid     Past Surgical History:  Procedure Laterality Date   COLONOSCOPY  08/09/2020   ROBOTIC ASSISTED TOTAL HYSTERECTOMY WITH BILATERAL SALPINGO OOPHERECTOMY Bilateral 12/02/2019   Procedure: XI ROBOTIC ASSISTED TOTAL HYSTERECTOMY WITH BILATERAL SALPINGECTOMY;  Surgeon: Genia Del, MD;  Location: [facility];  Service: Gynecology;  Laterality: Bilateral;   TONSILLECTOMY  1996    Family History  Problem Relation Age of Onset   Arthritis Mother    Miscarriages / [place] Sister    Arthritis Maternal Grandmother    Diabetes Maternal Grandmother    Diabetes Maternal Grandfather    Hearing loss Maternal Grandfather    Heart disease Maternal Grandfather    High blood pressure Maternal Grandfather    Stroke Maternal Grandfather    Colon cancer Neg Hx    Esophageal cancer Neg Hx    Stomach cancer Neg Hx    Rectal cancer Neg Hx  Social History   Tobacco Use   Smoking status: Never   Smokeless tobacco: Never  Substance Use Topics   Alcohol use: Yes    Comment: occasional    Subjective:   Presents for yearly CPE; husband is present for OV; no acute concerns today; defers labs today; will be starting online weight loss program ( Dr. Gabriel Rung) in November;- very excited about potential; defers labs today;   Review of Systems  Constitutional: Negative.   HENT: Negative.    Eyes: Negative.   Respiratory: Negative.    Cardiovascular: Negative.   Gastrointestinal: Negative.   Genitourinary: Negative.   Musculoskeletal: Negative.   Skin: Negative.   Neurological: Negative.   Endo/Heme/Allergies: Negative.   Psychiatric/Behavioral: Negative.       Objective:  Vitals:   07/19/23 1320  BP: 132/70  Pulse: 80   Resp: 18  Temp: 98.3 F (36.8 C)  TempSrc: Oral  SpO2: 98%  Weight: 222 lb 3.2 oz (100.8 kg)  Height: 5' 4.5" (1.638 m)    General: Well developed, well nourished, in no acute distress  Skin : Warm and dry.  Head: Normocephalic and atraumatic  Eyes: Sclera and conjunctiva clear; pupils round and reactive to light; extraocular movements intact  Ears: External normal; canals clear; tympanic membranes normal  Oropharynx: Pink, supple. No suspicious lesions  Neck: Supple without thyromegaly, adenopathy  Lungs: Respirations unlabored; clear to auscultation bilaterally without wheeze, rales, rhonchi  CVS exam: normal rate and regular rhythm.  Abdomen: Soft; nontender; nondistended; normoactive bowel sounds; no masses or hepatosplenomegaly  Musculoskeletal: No deformities; no active joint inflammation  Extremities: No edema, cyanosis, clubbing  Vessels: Symmetric bilaterally  Neurologic: Alert and oriented; speech intact; face symmetrical; moves all extremities well; CNII-XII intact without focal deficit   Assessment:  1. PE (physical exam), annual     Plan:  Age appropriate preventive healthcare needs addressed; encouraged regular eye doctor and dental exams; encouraged regular exercise; will update labs and refills as needed today; follow-up in 6 months, sooner prn.    Return in about 6 months (around 01/19/2024) for follow up.  No orders of the defined types were placed in this encounter.   Requested Prescriptions    No prescriptions requested or ordered in this encounter

## 2023-10-10 ENCOUNTER — Other Ambulatory Visit: Payer: Self-pay | Admitting: Family

## 2023-10-10 ENCOUNTER — Other Ambulatory Visit (HOSPITAL_COMMUNITY): Payer: Self-pay

## 2023-10-10 ENCOUNTER — Other Ambulatory Visit: Payer: Self-pay

## 2023-10-10 MED ORDER — LOSARTAN POTASSIUM-HCTZ 100-25 MG PO TABS
1.0000 | ORAL_TABLET | Freq: Every day | ORAL | 1 refills | Status: DC
  Filled 2023-10-10 – 2023-10-17 (×2): qty 90, 90d supply, fill #0

## 2023-10-10 MED ORDER — POTASSIUM CHLORIDE CRYS ER 20 MEQ PO TBCR
20.0000 meq | EXTENDED_RELEASE_TABLET | Freq: Every day | ORAL | 1 refills | Status: DC
  Filled 2023-10-10 – 2023-10-17 (×2): qty 90, 90d supply, fill #0

## 2023-10-17 ENCOUNTER — Other Ambulatory Visit (HOSPITAL_BASED_OUTPATIENT_CLINIC_OR_DEPARTMENT_OTHER): Payer: Self-pay

## 2023-10-17 ENCOUNTER — Other Ambulatory Visit (HOSPITAL_COMMUNITY): Payer: Self-pay

## 2024-01-21 ENCOUNTER — Ambulatory Visit: Payer: Commercial Managed Care - PPO | Admitting: Family

## 2024-01-21 ENCOUNTER — Encounter: Payer: Self-pay | Admitting: Family

## 2024-01-21 ENCOUNTER — Other Ambulatory Visit (HOSPITAL_BASED_OUTPATIENT_CLINIC_OR_DEPARTMENT_OTHER): Payer: Self-pay

## 2024-01-21 VITALS — BP 124/82 | HR 78 | Ht 64.5 in | Wt 216.0 lb

## 2024-01-21 DIAGNOSIS — I10 Essential (primary) hypertension: Secondary | ICD-10-CM | POA: Diagnosis not present

## 2024-01-21 DIAGNOSIS — Z6837 Body mass index (BMI) 37.0-37.9, adult: Secondary | ICD-10-CM | POA: Diagnosis not present

## 2024-01-21 DIAGNOSIS — E66812 Obesity, class 2: Secondary | ICD-10-CM | POA: Diagnosis not present

## 2024-01-21 DIAGNOSIS — L03111 Cellulitis of right axilla: Secondary | ICD-10-CM | POA: Diagnosis not present

## 2024-01-21 MED ORDER — DOXYCYCLINE HYCLATE 100 MG PO TABS
100.0000 mg | ORAL_TABLET | Freq: Two times a day (BID) | ORAL | 0 refills | Status: DC
  Filled 2024-01-21: qty 14, 7d supply, fill #0

## 2024-01-21 MED ORDER — LOSARTAN POTASSIUM-HCTZ 100-25 MG PO TABS
1.0000 | ORAL_TABLET | Freq: Every day | ORAL | 1 refills | Status: DC
  Filled 2024-01-21: qty 90, 90d supply, fill #0
  Filled 2024-05-21: qty 90, 90d supply, fill #1

## 2024-01-21 MED ORDER — POTASSIUM CHLORIDE CRYS ER 20 MEQ PO TBCR
20.0000 meq | EXTENDED_RELEASE_TABLET | Freq: Every day | ORAL | 1 refills | Status: DC
  Filled 2024-01-21: qty 90, 90d supply, fill #0
  Filled 2024-05-21: qty 90, 90d supply, fill #1

## 2024-01-21 NOTE — Progress Notes (Signed)
 Jessica Griffin is a 48 y.o. female with the following history as recorded in EpicCare:  Patient Active Problem List   Diagnosis Date Noted   Class 2 severe obesity with serious comorbidity and body mass index (BMI) of 37.0 to 37.9 in adult, unspecified obesity type (HCC) 01/21/2024   Acute frontal sinusitis 09/01/2021   Postoperative state 12/02/2019   Anxiety and depression 09/16/2017   Essential hypertension 03/06/2017    Current Outpatient Medications  Medication Sig Dispense Refill   aspirin EC 81 MG tablet Take 81 mg by mouth daily.      Biotin 5 MG TABS Take 2 tablets by mouth daily.     cetirizine (ZYRTEC) 10 MG chewable tablet Chew 10 mg by mouth daily.     Cholecalciferol (VITAMIN D3) 125 MCG (5000 UT) CAPS Take 1 capsule by mouth daily.     doxycycline (VIBRA-TABS) 100 MG tablet Take 1 tablet (100 mg total) by mouth 2 (two) times daily. 14 tablet 0   fluticasone (FLONASE) 50 MCG/ACT nasal spray Place 2 sprays into both nostrils daily. 16 g 6   loratadine (CLARITIN) 10 MG tablet Take 10 mg by mouth daily as needed for allergies.      LORazepam (ATIVAN) 0.5 MG tablet Take 1 tablet (0.5 mg total) by mouth daily as needed for anxiety. 30 tablet 1   magnesium gluconate (MAGONATE) 500 MG tablet Take 500 mg by mouth 2 (two) times daily.     Multiple Vitamin (MULTIVITAMIN) capsule Take 1 capsule by mouth daily.      Omega-3 1000 MG CAPS Take 1,000 mg by mouth daily.      Probiotic Product (CVS ADV PROBIOTIC GUMMIES) CHEW Chew 2 each by mouth daily.     vitamin B-12 (CYANOCOBALAMIN) 1000 MCG tablet Take by mouth.     vitamin C (ASCORBIC ACID) 500 MG tablet Take by mouth.     losartan-hydrochlorothiazide (HYZAAR) 100-25 MG tablet Take 1 tablet by mouth daily. 90 tablet 1   potassium chloride SA (KLOR-CON M) 20 MEQ tablet Take 1 tablet (20 mEq total) by mouth daily. 90 tablet 1   No current facility-administered medications for this visit.    Allergies: Sulfa antibiotics  Past  Medical History:  Diagnosis Date   Anxiety    Astigmatism    Bilateral swelling of feet and ankles    Constipation    Depression    DUB (dysfunctional uterine bleeding)    Enlarged heart    Hypertension    Myopia    Other fatigue    Palpitations    Presbyopia    Shortness of breath on exertion    SOB (shortness of breath)    Uterine fibroid     Past Surgical History:  Procedure Laterality Date   COLONOSCOPY  08/09/2020   ROBOTIC ASSISTED TOTAL HYSTERECTOMY WITH BILATERAL SALPINGO OOPHERECTOMY Bilateral 12/02/2019   Procedure: XI ROBOTIC ASSISTED TOTAL HYSTERECTOMY WITH BILATERAL SALPINGECTOMY;  Surgeon: Genia Del, MD;  Location: [facility];  Service: Gynecology;  Laterality: Bilateral;   TONSILLECTOMY  1996    Family History  Problem Relation Age of Onset   Arthritis Mother    Miscarriages / [place] Sister    Arthritis Maternal Grandmother    Diabetes Maternal Grandmother    Diabetes Maternal Grandfather    Hearing loss Maternal Grandfather    Heart disease Maternal Grandfather    High blood pressure Maternal Grandfather    Stroke Maternal Grandfather    Colon cancer Neg Hx  Esophageal cancer Neg Hx    Stomach cancer Neg Hx    Rectal cancer Neg Hx     Social History   Tobacco Use   Smoking status: Never   Smokeless tobacco: Never  Substance Use Topics   Alcohol use: Yes    Comment: occasional    Subjective:   6 month follow up on chronic care needs; has lost 6 pounds since last OV;  Concerned about "boil" in right arm pit x 3-4 days; very tender; has been applying warm compresses;   Objective:  Vitals:   01/21/24 1302  BP: 124/82  Pulse: 78  SpO2: 97%  Weight: 216 lb (98 kg)  Height: 5' 4.5" (1.638 m)    General: Well developed, well nourished, in no acute distress  Skin : Warm and dry.  Head: Normocephalic and atraumatic  Eyes: Sclera and conjunctiva clear; pupils round and reactive to light; extraocular movements intact   Ears: External normal; canals clear; tympanic membranes normal  Oropharynx: Pink, supple. No suspicious lesions  Neck: Supple without thyromegaly, adenopathy  Lungs: Respirations unlabored; clear to auscultation bilaterally without wheeze, rales, rhonchi  CVS exam: normal rate and regular rhythm.  Neurologic: Alert and oriented; speech intact; face symmetrical; moves all extremities well; CNII-XII intact without focal deficit   Assessment:  1. Essential hypertension   2. Class 2 severe obesity with serious comorbidity and body mass index (BMI) of 37.0 to 37.9 in adult, unspecified obesity type (HCC) Chronic  3. Cellulitis of right axilla     Plan:  Stable; continue same medications; will update refills at next OV; Patient has lost 6 pounds since last OV- patient will continue to work on weight loss goals; Encouraged to continue applying warm compresses; Rx for Doxycycline 100 mg bid x 7 days;   Plan for CPE in 6 months;   No follow-ups on file.  No orders of the defined types were placed in this encounter.   Requested Prescriptions   Signed Prescriptions Disp Refills   potassium chloride SA (KLOR-CON M) 20 MEQ tablet 90 tablet 1    Sig: Take 1 tablet (20 mEq total) by mouth daily.   losartan-hydrochlorothiazide (HYZAAR) 100-25 MG tablet 90 tablet 1    Sig: Take 1 tablet by mouth daily.   doxycycline (VIBRA-TABS) 100 MG tablet 14 tablet 0    Sig: Take 1 tablet (100 mg total) by mouth 2 (two) times daily.

## 2024-03-11 ENCOUNTER — Other Ambulatory Visit: Payer: Self-pay | Admitting: Family

## 2024-03-11 MED ORDER — LORAZEPAM 0.5 MG PO TABS
0.5000 mg | ORAL_TABLET | Freq: Every day | ORAL | 1 refills | Status: AC | PRN
  Filled 2024-03-11: qty 30, 30d supply, fill #0
  Filled 2024-08-24: qty 30, 30d supply, fill #1

## 2024-03-12 ENCOUNTER — Other Ambulatory Visit: Payer: Self-pay

## 2024-03-12 ENCOUNTER — Other Ambulatory Visit (HOSPITAL_BASED_OUTPATIENT_CLINIC_OR_DEPARTMENT_OTHER): Payer: Self-pay

## 2024-07-21 ENCOUNTER — Other Ambulatory Visit

## 2024-07-21 ENCOUNTER — Encounter: Payer: Self-pay | Admitting: Family

## 2024-07-21 ENCOUNTER — Other Ambulatory Visit (HOSPITAL_BASED_OUTPATIENT_CLINIC_OR_DEPARTMENT_OTHER): Payer: Self-pay

## 2024-07-21 ENCOUNTER — Ambulatory Visit: Payer: Self-pay | Admitting: Family

## 2024-07-21 ENCOUNTER — Ambulatory Visit (INDEPENDENT_AMBULATORY_CARE_PROVIDER_SITE_OTHER): Payer: Commercial Managed Care - PPO | Admitting: Family

## 2024-07-21 VITALS — BP 124/80 | HR 88 | Temp 98.1°F | Ht 64.5 in | Wt 217.4 lb

## 2024-07-21 DIAGNOSIS — Z1231 Encounter for screening mammogram for malignant neoplasm of breast: Secondary | ICD-10-CM | POA: Diagnosis not present

## 2024-07-21 DIAGNOSIS — Z13220 Encounter for screening for lipoid disorders: Secondary | ICD-10-CM | POA: Diagnosis not present

## 2024-07-21 DIAGNOSIS — Z23 Encounter for immunization: Secondary | ICD-10-CM | POA: Diagnosis not present

## 2024-07-21 DIAGNOSIS — Z0000 Encounter for general adult medical examination without abnormal findings: Secondary | ICD-10-CM

## 2024-07-21 LAB — COMPREHENSIVE METABOLIC PANEL WITH GFR
ALT: 22 U/L (ref 0–35)
AST: 21 U/L (ref 0–37)
Albumin: 4.3 g/dL (ref 3.5–5.2)
Alkaline Phosphatase: 76 U/L (ref 39–117)
BUN: 11 mg/dL (ref 6–23)
CO2: 29 meq/L (ref 19–32)
Calcium: 9.3 mg/dL (ref 8.4–10.5)
Chloride: 101 meq/L (ref 96–112)
Creatinine, Ser: 0.63 mg/dL (ref 0.40–1.20)
GFR: 103.57 mL/min (ref >60.00)
Glucose, Bld: 95 mg/dL (ref 70–99)
Potassium: 4.1 meq/L (ref 3.5–5.1)
Sodium: 141 meq/L (ref 135–145)
Total Bilirubin: 0.5 mg/dL (ref 0.2–1.2)
Total Protein: 7.1 g/dL (ref 6.0–8.3)

## 2024-07-21 LAB — CBC WITH DIFFERENTIAL/PLATELET
Basophils Absolute: 0 K/uL (ref 0.0–0.1)
Basophils Relative: 0.6 % (ref 0.0–3.0)
Eosinophils Absolute: 0.1 K/uL (ref 0.0–0.7)
Eosinophils Relative: 1.7 % (ref 0.0–5.0)
HCT: 40.8 % (ref 36.0–46.0)
Hemoglobin: 13.2 g/dL (ref 12.0–15.0)
Lymphocytes Relative: 35.5 % (ref 12.0–46.0)
Lymphs Abs: 2.3 K/uL (ref 0.7–4.0)
MCHC: 32.4 g/dL (ref 30.0–36.0)
MCV: 77.3 fl — ABNORMAL LOW (ref 78.0–100.0)
Monocytes Absolute: 0.4 K/uL (ref 0.1–1.0)
Monocytes Relative: 6.4 % (ref 3.0–12.0)
Neutro Abs: 3.7 K/uL (ref 1.4–7.7)
Neutrophils Relative %: 55.8 % (ref 43.0–77.0)
Platelets: 269 K/uL (ref 150.0–400.0)
RBC: 5.28 Mil/uL — ABNORMAL HIGH (ref 3.87–5.11)
RDW: 14.7 % (ref 11.5–15.5)
WBC: 6.5 K/uL (ref 4.0–10.5)

## 2024-07-21 LAB — LIPID PANEL
Cholesterol: 252 mg/dL — ABNORMAL HIGH (ref 0–200)
HDL: 41.4 mg/dL (ref >39.00)
LDL Cholesterol: 159 mg/dL — ABNORMAL HIGH (ref 0–99)
NonHDL: 210.84
Total CHOL/HDL Ratio: 6
Triglycerides: 260 mg/dL — ABNORMAL HIGH (ref 0.0–149.0)
VLDL: 52 mg/dL — ABNORMAL HIGH (ref 0.0–40.0)

## 2024-07-21 LAB — HEMOGLOBIN A1C: Hgb A1c MFr Bld: 6 % (ref 4.6–6.5)

## 2024-07-21 MED ORDER — POTASSIUM CHLORIDE CRYS ER 20 MEQ PO TBCR
20.0000 meq | EXTENDED_RELEASE_TABLET | Freq: Every day | ORAL | 3 refills | Status: AC
  Filled 2024-07-21 – 2024-08-23 (×2): qty 90, 90d supply, fill #0
  Filled 2024-12-09: qty 90, 90d supply, fill #1

## 2024-07-21 MED ORDER — LOSARTAN POTASSIUM-HCTZ 100-25 MG PO TABS
1.0000 | ORAL_TABLET | Freq: Every day | ORAL | 3 refills | Status: AC
  Filled 2024-07-21 – 2024-08-23 (×2): qty 90, 90d supply, fill #0
  Filled 2024-12-09: qty 90, 90d supply, fill #1

## 2024-07-21 MED ORDER — TRAZODONE HCL 50 MG PO TABS
25.0000 mg | ORAL_TABLET | Freq: Every evening | ORAL | 1 refills | Status: DC | PRN
  Filled 2024-07-21: qty 30, 30d supply, fill #0
  Filled 2024-08-23: qty 30, 30d supply, fill #1

## 2024-07-21 NOTE — Progress Notes (Signed)
 Jessica Griffin is a 48 y.o. female with the following history as recorded in EpicCare:  Patient Active Problem List   Diagnosis Date Noted   Class 2 severe obesity with serious comorbidity and body mass index (BMI) of 37.0 to 37.9 in adult, unspecified obesity type (HCC) 01/21/2024   Acute frontal sinusitis 09/01/2021   Postoperative state 12/02/2019   Anxiety and depression 09/16/2017   Essential hypertension 03/06/2017    Current Outpatient Medications  Medication Sig Dispense Refill   aspirin EC 81 MG tablet Take 81 mg by mouth daily.      Biotin 5 MG TABS Take 2 tablets by mouth daily.     cetirizine (ZYRTEC) 10 MG chewable tablet Chew 10 mg by mouth daily.     Cholecalciferol (VITAMIN D3) 125 MCG (5000 UT) CAPS Take 1 capsule by mouth daily.     fluticasone  (FLONASE ) 50 MCG/ACT nasal spray Place 2 sprays into both nostrils daily. 16 g 6   loratadine (CLARITIN) 10 MG tablet Take 10 mg by mouth daily as needed for allergies.      LORazepam  (ATIVAN ) 0.5 MG tablet Take 1 tablet (0.5 mg total) by mouth daily as needed for anxiety. 30 tablet 1   magnesium gluconate (MAGONATE) 500 MG tablet Take 500 mg by mouth 2 (two) times daily.     Multiple Vitamin (MULTIVITAMIN) capsule Take 1 capsule by mouth daily.      Omega-3 1000 MG CAPS Take 1,000 mg by mouth daily.      Probiotic Product ([employer]S ADV PROBIOTIC GUMMIES) CHEW Chew 2 each by mouth daily.     traZODone  (DESYREL ) 50 MG tablet Take 0.5-1 tablets (25-50 mg total) by mouth at bedtime as needed for sleep. 30 tablet 1   vitamin B-12 (CYANOCOBALAMIN ) 1000 MCG tablet Take by mouth.     vitamin C (ASCORBIC ACID) 500 MG tablet Take by mouth.     losartan -hydrochlorothiazide  (HYZAAR ) 100-25 MG tablet Take 1 tablet by mouth daily. 90 tablet 3   potassium chloride  SA (KLOR-CON  M) 20 MEQ tablet Take 1 tablet (20 mEq total) by mouth daily. 90 tablet 3   No current facility-administered medications for this visit.    Allergies: Sulfa  antibiotics  Past Medical History:  Diagnosis Date   Anxiety    Astigmatism    Bilateral swelling of feet and ankles    Constipation    Depression    DUB (dysfunctional uterine bleeding)    Enlarged heart    Hypertension    Myopia    Other fatigue    Palpitations    Presbyopia    Shortness of breath on exertion    SOB (shortness of breath)    Uterine fibroid     Past Surgical History:  Procedure Laterality Date   COLONOSCOPY  08/09/2020   ROBOTIC ASSISTED TOTAL HYSTERECTOMY WITH BILATERAL SALPINGO OOPHERECTOMY Bilateral 12/02/2019   Procedure: XI ROBOTIC ASSISTED TOTAL HYSTERECTOMY WITH BILATERAL SALPINGECTOMY;  Surgeon: Lavoie, Marie-Lyne, MD;  Location: [facility] [place];  Service: Gynecology;  Laterality: Bilateral;   TONSILLECTOMY  1996    Family History  Problem Relation Age of Onset   Arthritis Mother    Miscarriages / [place] Sister    Arthritis Maternal Grandmother    Diabetes Maternal Grandmother    Diabetes Maternal Grandfather    Hearing loss Maternal Grandfather    Heart disease Maternal Grandfather    High blood pressure Maternal Grandfather    Stroke Maternal Grandfather    Colon cancer Neg Hx  Esophageal cancer Neg Hx    Stomach cancer Neg Hx    Rectal cancer Neg Hx     Social History   Tobacco Use   Smoking status: Never   Smokeless tobacco: Never  Substance Use Topics   Alcohol use: Yes    Comment: occasional    Subjective:   Presents for yearly CPE; is s/p total hysterectomy; she is due for screening mammogram; notes that mood swings/ hot flashes are improving; not sleeping well;   Review of Systems  Constitutional: Negative.   HENT: Negative.    Eyes: Negative.   Respiratory: Negative.    Cardiovascular: Negative.   Gastrointestinal: Negative.   Genitourinary: Negative.   Musculoskeletal: Negative.   Skin: Negative.   Neurological: Negative.   Endo/Heme/Allergies: Negative.   Psychiatric/Behavioral: Negative.        Objective:  Vitals:   07/21/24 0857  BP: 124/80  Pulse: 88  Temp: 98.1 F (36.7 C)  TempSrc: Oral  SpO2: 94%  Weight: 217 lb 6.4 oz (98.6 kg)  Height: 5' 4.5 (1.638 m)    General: Well developed, well nourished, in no acute distress  Skin : Warm and dry.  Head: Normocephalic and atraumatic  Eyes: Sclera and conjunctiva clear; pupils round and reactive to light; extraocular movements intact  Ears: External normal; canals clear; tympanic membranes normal  Oropharynx: Pink, supple. No suspicious lesions  Neck: Supple without thyromegaly, adenopathy  Lungs: Respirations unlabored; clear to auscultation bilaterally without wheeze, rales, rhonchi  CVS exam: normal rate and regular rhythm.  Abdomen: Soft; nontender; nondistended; normoactive bowel sounds; no masses or hepatosplenomegaly  Musculoskeletal: No deformities; no active joint inflammation  Extremities: No edema, cyanosis, clubbing  Vessels: Symmetric bilaterally  Neurologic: Alert and oriented; speech intact; face symmetrical; moves all extremities well; CNII-XII intact without focal deficit   Assessment:  1. PE (physical exam), annual   2. Lipid screening   3. Visit for screening mammogram     Plan:  Age appropriate preventive healthcare needs addressed; encouraged regular eye doctor and dental exams; encouraged regular exercise and working on weight loss goals; can try Trazodone  to help with insomnia;  will update labs and refills as needed today; follow-up in 6 months- 1 year;  Patient is aware that provider is leaving at the end of September and she will need to find a new PCP.    No follow-ups on file.  Orders Placed This Encounter  Procedures   MM Digital Screening    Standing Status:   Future    Expiration Date:   07/21/2025    Reason for Exam (SYMPTOM  OR DIAGNOSIS REQUIRED):   screening mammogram    Is the patient pregnant?:   No    Preferred imaging location?:   [facility]   CBC with  Differential/Platelet   Comp Met (CMET)   Lipid panel   Hemoglobin A1c    Requested Prescriptions   Signed Prescriptions Disp Refills   losartan -hydrochlorothiazide  (HYZAAR ) 100-25 MG tablet 90 tablet 3    Sig: Take 1 tablet by mouth daily.   potassium chloride  SA (KLOR-CON  M) 20 MEQ tablet 90 tablet 3    Sig: Take 1 tablet (20 mEq total) by mouth daily.   traZODone  (DESYREL ) 50 MG tablet 30 tablet 1    Sig: Take 0.5-1 tablets (25-50 mg total) by mouth at bedtime as needed for sleep.

## 2024-07-21 NOTE — Addendum Note (Signed)
 Addended by: ORVIN HARLENE HERO on: 07/21/2024 10:01 AM   Modules accepted: Orders

## 2024-07-21 NOTE — Addendum Note (Signed)
 Addended by: TRUDY CURVIN RAMAN on: 07/21/2024 09:44 AM   Modules accepted: Orders

## 2024-07-21 NOTE — Addendum Note (Signed)
 Addended by: TRUDY CURVIN RAMAN on: 07/21/2024 09:45 AM   Modules accepted: Orders

## 2024-08-10 ENCOUNTER — Encounter: Payer: Self-pay | Admitting: Family

## 2024-08-11 ENCOUNTER — Other Ambulatory Visit: Payer: Self-pay | Admitting: Family

## 2024-08-11 DIAGNOSIS — M549 Dorsalgia, unspecified: Secondary | ICD-10-CM

## 2024-08-11 DIAGNOSIS — Z1231 Encounter for screening mammogram for malignant neoplasm of breast: Secondary | ICD-10-CM

## 2024-08-13 ENCOUNTER — Ambulatory Visit (HOSPITAL_BASED_OUTPATIENT_CLINIC_OR_DEPARTMENT_OTHER)
Admission: RE | Admit: 2024-08-13 | Discharge: 2024-08-13 | Disposition: A | Discharge status: Home / Self Care | Source: Ambulatory Visit | Attending: Family | Admitting: Family

## 2024-08-13 ENCOUNTER — Other Ambulatory Visit: Payer: Self-pay | Admitting: Medical Genetics

## 2024-08-13 DIAGNOSIS — M5134 Other intervertebral disc degeneration, thoracic region: Secondary | ICD-10-CM | POA: Diagnosis not present

## 2024-08-13 DIAGNOSIS — M47814 Spondylosis without myelopathy or radiculopathy, thoracic region: Secondary | ICD-10-CM | POA: Diagnosis not present

## 2024-08-13 DIAGNOSIS — M4316 Spondylolisthesis, lumbar region: Secondary | ICD-10-CM | POA: Diagnosis not present

## 2024-08-13 DIAGNOSIS — M47816 Spondylosis without myelopathy or radiculopathy, lumbar region: Secondary | ICD-10-CM | POA: Diagnosis not present

## 2024-08-13 DIAGNOSIS — M5136 Other intervertebral disc degeneration, lumbar region with discogenic back pain only: Secondary | ICD-10-CM | POA: Diagnosis not present

## 2024-08-13 DIAGNOSIS — M549 Dorsalgia, unspecified: Secondary | ICD-10-CM | POA: Insufficient documentation

## 2024-08-20 ENCOUNTER — Ambulatory Visit

## 2024-08-20 DIAGNOSIS — Z1231 Encounter for screening mammogram for malignant neoplasm of breast: Secondary | ICD-10-CM

## 2024-08-23 ENCOUNTER — Other Ambulatory Visit (HOSPITAL_BASED_OUTPATIENT_CLINIC_OR_DEPARTMENT_OTHER): Payer: Self-pay

## 2024-08-23 ENCOUNTER — Other Ambulatory Visit: Payer: Self-pay

## 2024-08-24 ENCOUNTER — Other Ambulatory Visit: Payer: Self-pay

## 2024-09-22 ENCOUNTER — Ambulatory Visit

## 2024-10-05 ENCOUNTER — Other Ambulatory Visit: Payer: Self-pay | Admitting: Family Medicine

## 2024-10-05 ENCOUNTER — Telehealth: Payer: Self-pay | Admitting: Family

## 2024-10-05 DIAGNOSIS — Z1231 Encounter for screening mammogram for malignant neoplasm of breast: Secondary | ICD-10-CM

## 2024-10-05 NOTE — Telephone Encounter (Signed)
 Pt was a LM pt and says laura ordered an x-ray for her but since laura left no one has ever called her back with the results from her x-ray. Pt would like someone to call her to go over the results with her. She has a toc to [facility] on 01/15/25

## 2024-10-07 NOTE — Telephone Encounter (Signed)
 Pt notified of results.  She is feeling a little better now since she is doing stretches.  Will call back for an appointment if pain worsens.

## 2024-10-14 ENCOUNTER — Ambulatory Visit
Admission: RE | Admit: 2024-10-14 | Discharge: 2024-10-14 | Disposition: A | Discharge status: Home / Self Care | Source: Ambulatory Visit | Attending: Family Medicine | Admitting: Family Medicine

## 2024-10-14 DIAGNOSIS — Z1231 Encounter for screening mammogram for malignant neoplasm of breast: Secondary | ICD-10-CM | POA: Diagnosis not present

## 2024-10-30 ENCOUNTER — Other Ambulatory Visit (HOSPITAL_COMMUNITY)

## 2024-12-01 ENCOUNTER — Other Ambulatory Visit: Payer: Self-pay | Admitting: Medical Genetics

## 2024-12-01 DIAGNOSIS — Z006 Encounter for examination for normal comparison and control in clinical research program: Secondary | ICD-10-CM

## 2024-12-09 ENCOUNTER — Encounter (HOSPITAL_BASED_OUTPATIENT_CLINIC_OR_DEPARTMENT_OTHER): Payer: Self-pay

## 2024-12-09 ENCOUNTER — Other Ambulatory Visit: Payer: Self-pay | Admitting: Family Medicine

## 2024-12-09 ENCOUNTER — Other Ambulatory Visit (HOSPITAL_BASED_OUTPATIENT_CLINIC_OR_DEPARTMENT_OTHER): Payer: Self-pay

## 2024-12-09 ENCOUNTER — Other Ambulatory Visit: Payer: Self-pay

## 2024-12-09 MED ORDER — TRAZODONE HCL 50 MG PO TABS
25.0000 mg | ORAL_TABLET | Freq: Every evening | ORAL | 1 refills | Status: AC | PRN
  Filled 2024-12-09: qty 30, 30d supply, fill #0

## 2024-12-21 LAB — GENECONNECT MOLECULAR SCREEN: Genetic Analysis Overall Interpretation: NEGATIVE

## 2025-01-05 ENCOUNTER — Other Ambulatory Visit (HOSPITAL_COMMUNITY)

## 2025-01-14 ENCOUNTER — Encounter: Admitting: Student

## 2025-01-15 ENCOUNTER — Encounter: Admitting: Student

## 2025-01-22 ENCOUNTER — Encounter: Admitting: Student
